# Patient Record
Sex: Male | Born: 1990 | Race: White | Hispanic: No | Marital: Single
Health system: Midwestern US, Community
[De-identification: ages and names within clinical notes are randomized; demographics above are authoritative.]

## PROBLEM LIST (undated history)

## (undated) DIAGNOSIS — M7730 Calcaneal spur, unspecified foot: Secondary | ICD-10-CM

## (undated) HISTORY — PX: TONSILLECTOMY: SUR1361

---

## 2009-04-26 ENCOUNTER — Emergency Department: Payer: Self-pay | Admitting: Emergency Medicine

## 2009-04-27 ENCOUNTER — Emergency Department: Payer: Self-pay | Admitting: Emergency Medicine

## 2015-01-08 ENCOUNTER — Emergency Department (HOSPITAL_COMMUNITY)
Admission: EM | Admit: 2015-01-08 | Discharge: 2015-01-08 | Disposition: A | Discharge status: Home / Self Care | Payer: Self-pay | Attending: Emergency Medicine | Admitting: Emergency Medicine

## 2015-01-08 ENCOUNTER — Encounter (HOSPITAL_COMMUNITY): Payer: Self-pay | Admitting: Emergency Medicine

## 2015-01-08 DIAGNOSIS — E669 Obesity, unspecified: Secondary | ICD-10-CM | POA: Insufficient documentation

## 2015-01-08 DIAGNOSIS — K088 Other specified disorders of teeth and supporting structures: Secondary | ICD-10-CM | POA: Insufficient documentation

## 2015-01-08 DIAGNOSIS — K029 Dental caries, unspecified: Secondary | ICD-10-CM | POA: Insufficient documentation

## 2015-01-08 MED ORDER — HYDROCODONE-ACETAMINOPHEN 5-325 MG PO TABS: 1.0000 | ORAL_TABLET | Freq: Four times a day (QID) | ORAL | Status: DC | PRN

## 2015-01-08 MED ORDER — IBUPROFEN 800 MG PO TABS: 800.0000 mg | ORAL_TABLET | Freq: Three times a day (TID) | ORAL | Status: DC

## 2015-01-08 MED ORDER — PENICILLIN V POTASSIUM 500 MG PO TABS: 500.0000 mg | ORAL_TABLET | Freq: Three times a day (TID) | ORAL | Status: DC

## 2015-01-08 NOTE — ED Notes (Signed)
Piece broke off of uppper back left side tooth couple days ago.

## 2015-01-08 NOTE — Discharge Instructions (Signed)

## 2015-01-08 NOTE — ED Provider Notes (Signed)
CSN: [id number]     Arrival date & time 01/08/15  1120 History   First MD Initiated Contact with Patient 01/08/15 1121     No chief complaint on file.    (Consider location/radiation/quality/duration/timing/severity/associated sxs/prior Treatment) HPI   24 year old male presents for evaluation of dental pain. Patient reports he chipped his left upper tooth possibly 2 days ago while eating. States that he developed sharp shooting pain since the injury and the pain has gotten progressively worse. Pain is worsened with chewing, cold air, and breathing. He has tried over-the-counter ibuprofen with minimal relief. He reports subjective fever. He denies any hearing changes, sore throat, neck pain, chest pain, or rash. He is a smoker and does not have a dentist he is not allergic to any medication.  No past medical history on file. No past surgical history on file. No family history on file. History  Substance Use Topics  . Smoking status: Not on file  . Smokeless tobacco: Not on file  . Alcohol Use: Not on file    Review of Systems  Constitutional: Negative for fever.  HENT: Positive for dental problem. Negative for facial swelling.   Skin: Negative for rash and wound.  Neurological: Negative for numbness and headaches.      Allergies  Review of patient's allergies indicates not on file.  Home Medications   Prior to Admission medications   Not on File   There were no vitals taken for this visit. Physical Exam  Constitutional: He appears well-developed and well-nourished. No distress.  Moderately obese Caucasian male appears to be in no acute distress, talking without difficulty.  HENT:  Head: Atraumatic.  Poor dentition throughout, significant dental decay noted to tooth #15, tender to palpation with mild gingival erythema to the surrounding gumline. No trismus, no obvious abscess amenable for drainage.  Eyes: Conjunctivae are normal.  Neck: Normal range of motion. Neck  supple.  Lymphadenopathy:    He has no cervical adenopathy.  Neurological: He is alert.  Skin: No rash noted.  Psychiatric: He has a normal mood and affect.    ED Course  Procedures (including critical care time)  Patient presents with dental pain, dental referral given along with antibiotic and pain medication.  Labs Review Labs Reviewed - No data to display  Imaging Review No results found.   EKG Interpretation None      MDM   Final diagnoses:  Pain due to dental caries    BP 132/69 mmHg  Pulse 91  Temp(Src) 98.6 F (37 C) (Oral)  Resp 20  SpO2 96%     Fayrene HelperBowie Julious Langlois, PA-C 01/08/15 1131  Gerhard Munchobert Lockwood, MD 01/08/15 1605

## 2015-07-17 ENCOUNTER — Emergency Department (HOSPITAL_COMMUNITY): Payer: Self-pay

## 2015-07-17 ENCOUNTER — Emergency Department (HOSPITAL_COMMUNITY)
Admission: EM | Admit: 2015-07-17 | Discharge: 2015-07-17 | Disposition: A | Discharge status: Home / Self Care | Payer: Self-pay | Attending: Emergency Medicine | Admitting: Emergency Medicine

## 2015-07-17 ENCOUNTER — Encounter (HOSPITAL_COMMUNITY): Payer: Self-pay | Admitting: Emergency Medicine

## 2015-07-17 DIAGNOSIS — Z792 Long term (current) use of antibiotics: Secondary | ICD-10-CM | POA: Insufficient documentation

## 2015-07-17 DIAGNOSIS — Z791 Long term (current) use of non-steroidal anti-inflammatories (NSAID): Secondary | ICD-10-CM | POA: Insufficient documentation

## 2015-07-17 DIAGNOSIS — M722 Plantar fascial fibromatosis: Secondary | ICD-10-CM | POA: Insufficient documentation

## 2015-07-17 DIAGNOSIS — Z720 Tobacco use: Secondary | ICD-10-CM | POA: Insufficient documentation

## 2015-07-17 MED ORDER — NAPROXEN 500 MG PO TABS: 500.0000 mg | ORAL_TABLET | Freq: Two times a day (BID) | ORAL | Status: DC

## 2015-07-17 MED ORDER — TRAMADOL HCL 50 MG PO TABS: 50.0000 mg | ORAL_TABLET | Freq: Four times a day (QID) | ORAL | Status: DC | PRN

## 2015-07-17 NOTE — ED Notes (Signed)
Pt reports 2 month hx of l/foot pain. Pt stated that the foot is cramping with a stabbing pain through his heel.

## 2015-07-17 NOTE — ED Provider Notes (Signed)
CSN: [id number]     Arrival date & time 07/17/15  1631 History  This chart was scribed for non-physician practitioner, Jaynie Crumble, PA-C, working with Mancel Bale, MD, by Budd Palmer ED Scribe. This patient was seen in room WTR5/WTR5 and the patient's care was started at [facility]    Chief Complaint  Patient presents with  . Foot Pain    l/foot pain x 2 months   The history is provided by the patient. No language interpreter was used.   HPI Comments: David Holder is a 24 y.o. male who presents to the Emergency Department complaining of cramping, shooting left heel pain onset 2 months ago. He notes exacerbation with pressure, weight bearing, and walking. He has taken ibuprofen for pain with mild relief. He denies any previous injuries to the foot.   History reviewed. No pertinent past medical history. Past Surgical History  Procedure Laterality Date  . Tonsillectomy     History reviewed. No pertinent family history. History  Substance Use Topics  . Smoking status: Current Some Day Smoker    Types: Cigarettes  . Smokeless tobacco: Not on file  . Alcohol Use: No    Review of Systems  Constitutional: Negative for fever.  Musculoskeletal: Positive for myalgias.  Skin: Negative for wound.    Allergies  Codeine  Home Medications   Prior to Admission medications   Medication Sig Start Date End Date Taking? Authorizing Provider  HYDROcodone-acetaminophen (NORCO/VICODIN) 5-325 MG per tablet Take 1 tablet by mouth every 6 (six) hours as needed for moderate pain or severe pain. 01/08/15   Fayrene Helper, PA-C  ibuprofen (ADVIL,MOTRIN) 800 MG tablet Take 1 tablet (800 mg total) by mouth 3 (three) times daily. 01/08/15   Fayrene Helper, PA-C  penicillin v potassium (VEETID) 500 MG tablet Take 1 tablet (500 mg total) by mouth 3 (three) times daily. 01/08/15   Fayrene Helper, PA-C   BP 127/66 mmHg  Pulse 92  Temp(Src) 97.7 F (36.5 C) (Oral)  Resp 20  SpO2 100% Physical Exam   Constitutional: He is oriented to person, place, and time. He appears well-developed and well-nourished. No distress.  HENT:  Head: Normocephalic and atraumatic.  Mouth/Throat: Oropharynx is clear and moist.  Eyes: Conjunctivae and EOM are normal. Pupils are equal, round, and reactive to light.  Neck: Normal range of motion. Neck supple. No tracheal deviation present.  Cardiovascular: Normal rate.   Pulmonary/Chest: Breath sounds normal. No respiratory distress.  Abdominal: Soft.  Musculoskeletal: Normal range of motion.  Normal-appearing foot and heel. There is no erythema, discoloration, signs of infection. Range of motion of all toes, foot, ankle. Pain with toe extension. Dorsal pedal pulses intact.  Neurological: He is alert and oriented to person, place, and time.  Skin: Skin is warm and dry.  Psychiatric: He has a normal mood and affect. His behavior is normal.  Nursing note and vitals reviewed.   ED Course  Procedures  DIAGNOSTIC STUDIES: Oxygen Saturation is 100% on RA, normal by my interpretation.    COORDINATION OF CARE: 5:32 PM - Discussed possible plantar fasciitis and bone spur on foot XR. Advised to rest, apply ice packs, elevate, and get supportive footwear/orthotics. Advised to take anti-inflammatories for pain. Recommended seeing a podiatrist if the pain does not get better. Pt advised of plan for treatment and pt agrees.  Labs Review Labs Reviewed - No data to display  Imaging Review Dg Foot Complete Left  07/17/2015   CLINICAL DATA:  The left foot pain  on going for 2 months. Possible bone spur. Pain is at the bottom of of OS calcis. Painful to walk. No history of previous injury or surgery.  EXAM: LEFT FOOT - COMPLETE 3+ VIEW  COMPARISON:  None.  FINDINGS: There is no evidence of fracture or dislocation. Soft tissues are unremarkable. Small plantar calcaneal spur is noted.  IMPRESSION: 1.  No evidence for acute  abnormality. 2. Small plantar calcaneal spur.    Electronically Signed   By: Norva Pavlov M.D.   On: 07/17/2015 17:22     EKG Interpretation None      MDM   Final diagnoses:  Plantar fasciitis of left foot    Patient is here with left heel pain. Exam is consistent with plantar fasciitis and heel pain from most likely calcaneal spur as seen on the x-ray. Advised to get a good arch supports. NSAIDs, ice, follow up as needed. Will discharge home with tramadol, naproxen. Follow-up as needed.  Filed Vitals:   07/17/15 1642 07/17/15 1758  BP: 127/66   Pulse: 92 93  Temp: 97.7 F (36.5 C)   TempSrc: Oral   Resp: 20   SpO2: 100% 99%      Jaynie Crumble, PA-C 07/18/15 1647  Mancel Bale, MD 07/18/15 2316

## 2015-07-17 NOTE — Discharge Instructions (Signed)
Naprosyn for pain and inflammation. Tramadol for severe pain. Ice. Elevate. Follow up with primary care doctor or orthopedics specialist. Make sure to get good arch supports. Ice. Follow up with primary care doctor.    Heel Spur A heel spur is a hook of bone that can form on the calcaneus (the heel bone and the largest bone of the foot). Heel spurs are often associated with plantar fasciitis and usually come in people who have had the problem for an extended period of time. The cause of the relationship is unknown. The pain associated with them is thought to be caused by an inflammation (soreness and redness) of the plantar fascia rather than the spur itself. The plantar fascia is a thick fibrous like tissue that runs from the calcaneus (heel bone) to the ball of the foot. This strong, tight tissue helps maintain the arch of your foot. It helps distribute the weight across your foot as you walk or run. Stresses placed on the plantar fascia can be tremendous. When it is inflamed normal activities become painful. Pain is worse in the morning after sleeping. After sleeping the plantar fascia is tight. The first movements stretch the fascia and this causes pain. As the tendon loosens, the pain usually gets better. It often returns with too much standing or walking.  About 70% of patients with plantar fasciitis have a heel spur. About half of people without foot pain also have heel spurs. DIAGNOSIS  The diagnosis of a heel spur is made by X-ray. The X-ray shows a hook of bone protruding from the bottom of the calcaneus at the point where the plantar fascia is attached to the heel bone.  TREATMENT  It is necessary to find out what is causing the stretching of the plantar fascia. If the cause is over-pronation (flat feet), orthotics and proper foot ware may help.  Stretching exercises, losing weight, wearing shoes that have a cushioned heel that absorbs shock, and elevating the heel with the use of a heel  cradle, heel cup, or orthotics may all help. Heel cradles and heel cups provide extra comfort and cushion to the heel, and reduce the amount of shock to the sore area. AVOIDING THE PAIN OF PLANTAR FASCIITIS AND HEEL SPURS  Consult a sports medicine professional before beginning a new exercise program.  Walking programs offer a good workout. There is a lower chance of overuse injuries common to the runners. There is less impact and less jarring of the joints.  Begin all new exercise programs slowly. If problems or pains develop, decrease the amount of time or distance until you are at a comfortable level.  Wear good shoes and replace them regularly.  Stretch your foot and the heel cords at the back of the ankle (Achilles tendons) both before and after exercise.  Run or exercise on even surfaces that are not hard. For example, asphalt is better than pavement.  Do not run barefoot on hard surfaces.  If using a treadmill, vary the incline.  Do not continue to workout if you have foot or joint problems. Seek professional help if they do not improve. HOME CARE INSTRUCTIONS   Avoid activities that cause you pain until you recover.  Use ice or cold packs to the problem or painful areas after working out.  Only take over-the-counter or prescription medicines for pain, discomfort, or fever as directed by your caregiver.  Soft shoe inserts or athletic shoes with air or gel sole cushions may be helpful.  If  problems continue or become more severe, consult a sports medicine caregiver. Cortisone is a potent anti-inflammatory medication that may be injected into the painful area. You can discuss this treatment with your caregiver. MAKE SURE YOU:   Understand these instructions.  Will watch your condition.  Will get help right away if you are not doing well or get worse. Document Released: 01/05/2006 Document Revised: 02/21/2012 Document Reviewed: 01/30/2014 [facility] Patient Information 2015  [place], [place]. This information is not intended to replace advice given to you by your health care provider. Make sure you discuss any questions you have with your health care provider.  Plantar Fasciitis Plantar fasciitis is a common condition that causes foot pain. It is soreness (inflammation) of the band of tough fibrous tissue on the bottom of the foot that runs from the heel bone (calcaneus) to the ball of the foot. The cause of this soreness may be from excessive standing, poor fitting shoes, running on hard surfaces, being overweight, having an abnormal walk, or overuse (this is common in runners) of the painful foot or feet. It is also common in aerobic exercise dancers and ballet dancers. SYMPTOMS  Most people with plantar fasciitis complain of:  Severe pain in the morning on the bottom of their foot especially when taking the first steps out of bed. This pain recedes after a few minutes of walking.  Severe pain is experienced also during walking following a long period of inactivity.  Pain is worse when walking barefoot or up stairs DIAGNOSIS   Your caregiver will diagnose this condition by examining and feeling your foot.  Special tests such as X-rays of your foot, are usually not needed. PREVENTION   Consult a sports medicine professional before beginning a new exercise program.  Walking programs offer a good workout. With walking there is a lower chance of overuse injuries common to runners. There is less impact and less jarring of the joints.  Begin all new exercise programs slowly. If problems or pain develop, decrease the amount of time or distance until you are at a comfortable level.  Wear good shoes and replace them regularly.  Stretch your foot and the heel cords at the back of the ankle (Achilles tendon) both before and after exercise.  Run or exercise on even surfaces that are not hard. For example, asphalt is better than pavement.  Do not run barefoot on hard  surfaces.  If using a treadmill, vary the incline.  Do not continue to workout if you have foot or joint problems. Seek professional help if they do not improve. HOME CARE INSTRUCTIONS   Avoid activities that cause you pain until you recover.  Use ice or cold packs on the problem or painful areas after working out.  Only take over-the-counter or prescription medicines for pain, discomfort, or fever as directed by your caregiver.  Soft shoe inserts or athletic shoes with air or gel sole cushions may be helpful.  If problems continue or become more severe, consult a sports medicine caregiver or your own health care provider. Cortisone is a potent anti-inflammatory medication that may be injected into the painful area. You can discuss this treatment with your caregiver. MAKE SURE YOU:   Understand these instructions.  Will watch your condition.  Will get help right away if you are not doing well or get worse. Document Released: 08/24/2001 Document Revised: 02/21/2012 Document Reviewed: 10/23/2008 [facility] Patient Information 2015 [place], [place]. This information is not intended to replace advice given to you by  your health care provider. Make sure you discuss any questions you have with your health care provider. ° °

## 2015-07-17 NOTE — Progress Notes (Signed)
Orthopedic Tech Progress Note Patient Details:  David Holder September 25, 1991 [id number]  Ortho Devices Type of Ortho Device: Crutches Ortho Device/Splint Interventions: Application   Shawnie Pons 07/17/2015, 6:25 PM

## 2016-04-13 ENCOUNTER — Encounter (HOSPITAL_BASED_OUTPATIENT_CLINIC_OR_DEPARTMENT_OTHER): Payer: Self-pay | Admitting: Emergency Medicine

## 2016-04-13 ENCOUNTER — Emergency Department (HOSPITAL_BASED_OUTPATIENT_CLINIC_OR_DEPARTMENT_OTHER)
Admission: EM | Admit: 2016-04-13 | Discharge: 2016-04-13 | Disposition: A | Discharge status: Home / Self Care | Payer: Self-pay | Attending: Emergency Medicine | Admitting: Emergency Medicine

## 2016-04-13 DIAGNOSIS — K0889 Other specified disorders of teeth and supporting structures: Secondary | ICD-10-CM | POA: Insufficient documentation

## 2016-04-13 DIAGNOSIS — F17210 Nicotine dependence, cigarettes, uncomplicated: Secondary | ICD-10-CM | POA: Insufficient documentation

## 2016-04-13 MED ORDER — PENICILLIN V POTASSIUM 500 MG PO TABS: 500.0000 mg | ORAL_TABLET | Freq: Four times a day (QID) | ORAL | Status: DC

## 2016-04-13 MED ORDER — NAPROXEN 500 MG PO TABS: 500.0000 mg | ORAL_TABLET | Freq: Two times a day (BID) | ORAL | Status: DC

## 2016-04-13 NOTE — Discharge Instructions (Signed)

## 2016-04-13 NOTE — ED Notes (Signed)
Went to room to discharge patient.  Pt had left prior to receiving paperwork.   Unable to locate patient in lobby or near pharmacy.

## 2016-04-13 NOTE — ED Provider Notes (Signed)
CSN: [id number]     Arrival date & time 04/13/16  7829 History   First MD Initiated Contact with Patient 04/13/16 [phone]     Chief Complaint  Patient presents with  . Dental Pain    David Holder is a 25 y.o. male who presents to the ED Complaining of one month of right upper dental pain that has worsened over the past several weeks. He complains of 8 out of 10 right upper dental pain currently. He reports several months ago he broke his right upper molar and since has had worsening pain. He reports seeing some ibuprofen with little relief today. He reports his pain is worse with chewing and eating. He has not been able to follow-up with her dental provider. He denies sore throat, fevers, trouble swallowing, discharge from his mouth, facial swelling, ear pain, ear discharge, neck pain.   Patient is a 25 y.o. male presenting with tooth pain. The history is provided by the patient. No language interpreter was used.  Dental Pain Associated symptoms: no drooling, no facial swelling, no fever, no headaches and no neck pain     History reviewed. No pertinent past medical history. Past Surgical History  Procedure Laterality Date  . Tonsillectomy     No family history on file. Social History  Substance Use Topics  . Smoking status: Current Some Day Smoker    Types: Cigarettes  . Smokeless tobacco: None  . Alcohol Use: No    Review of Systems  Constitutional: Negative for fever and chills.  HENT: Positive for dental problem. Negative for drooling, ear pain, facial swelling, sore throat and trouble swallowing.   Eyes: Negative for pain and visual disturbance.  Gastrointestinal: Negative for nausea and vomiting.  Musculoskeletal: Negative for neck pain.  Skin: Negative for rash.  Neurological: Negative for headaches.      Allergies  Codeine  Home Medications   Prior to Admission medications   Medication Sig Start Date End Date Taking? Authorizing Provider  naproxen (NAPROSYN) 500  MG tablet Take 1 tablet (500 mg total) by mouth 2 (two) times daily with a meal. 04/13/16   Everlene Farrier, PA-C  penicillin v potassium (VEETID) 500 MG tablet Take 1 tablet (500 mg total) by mouth 4 (four) times daily. 04/13/16   Everlene Farrier, PA-C  traMADol (ULTRAM) 50 MG tablet Take 1 tablet (50 mg total) by mouth every 6 (six) hours as needed. 07/17/15   Tatyana Kirichenko, PA-C   BP 149/90 mmHg  Pulse 114  Temp(Src) 98.7 F (37.1 C) (Oral)  Resp 20  Ht  (1.753 m)  Wt 136.079 kg  BMI 44.28 kg/m2  SpO2 99% Physical Exam  Constitutional: He is oriented to person, place, and time. He appears well-developed and well-nourished. No distress.  Non-toxic appearing.   HENT:  Head: Normocephalic and atraumatic.  Right Ear: External ear normal.  Left Ear: External ear normal.  Mouth/Throat: Oropharynx is clear and moist. No oropharyngeal exudate.  Tenderness to right upper molar which is cracked. Multiple dental caries and poor dentition.  No discharge from the mouth. No facial swelling.  Uvula is midline without edema. Soft palate rises symmetrically. No tonsillar hypertrophy or exudates. Tongue protrusion is normal.  No trismus.   Eyes: Conjunctivae and EOM are normal. Pupils are equal, round, and reactive to light. Right eye exhibits no discharge. Left eye exhibits no discharge.  Neck: Normal range of motion. Neck supple. No JVD present. No tracheal deviation present.  Cardiovascular: Normal rate, regular rhythm,  normal heart sounds and intact distal pulses.   HR is 88  Pulmonary/Chest: Effort normal and breath sounds normal. No respiratory distress.  Lymphadenopathy:    He has no cervical adenopathy.  Neurological: He is alert and oriented to person, place, and time. Coordination normal.  Skin: Skin is warm and dry. No rash noted. He is not diaphoretic. No erythema. No pallor.  Psychiatric: He has a normal mood and affect. His behavior is normal.  Nursing note and vitals  reviewed.   ED Course  Procedures (including critical care time) Labs Review Labs Reviewed - No data to display  Imaging Review No results found.    EKG Interpretation None      Filed Vitals:   04/13/16 0908  BP: 149/90  Pulse: 114  Temp: 98.7 F (37.1 C)  TempSrc: Oral  Resp: 20  Height: 5\' 9"  (1.753 m)  Weight: 136.079 kg  SpO2: 99%     MDM   Meds given in ED:  Medications - No data to display  New Prescriptions   NAPROXEN (NAPROSYN) 500 MG TABLET    Take 1 tablet (500 mg total) by mouth 2 (two) times daily with a meal.   PENICILLIN V POTASSIUM (VEETID) 500 MG TABLET    Take 1 tablet (500 mg total) by mouth 4 (four) times daily.    Final diagnoses:  Pain, dental   This is a 25 y.o. male who presents to the ED Complaining of one month of right upper dental pain that has worsened over the past several weeks. He complains of 8 out of 10 right upper dental pain currently. He reports several months ago he broke his right upper molar and since has had worsening pain. Patient with toothache.  No gross abscess.  Exam unconcerning for Ludwig's angina or spread of infection.  Will treat with penicillin and pain medicine.  Urged patient to follow-up with dentist Dr. Russella DarBenitez and take his discharge instructions to his appointment. I advised the patient to follow-up with their primary care provider this week. I advised the patient to return to the emergency department with new or worsening symptoms or new concerns. The patient verbalized understanding and agreement with plan.     Everlene FarrierWilliam Ivannia Willhelm, PA-C 04/13/16 1021  Jerelyn ScottMartha Linker, MD 04/13/16 1030

## 2016-04-13 NOTE — ED Notes (Addendum)
R upper tooth pain x several weeks. Pt states he has a broken tooth and cannot afford a dentist.

## 2016-05-21 ENCOUNTER — Encounter (HOSPITAL_COMMUNITY): Payer: Self-pay | Admitting: Emergency Medicine

## 2016-05-21 ENCOUNTER — Emergency Department (HOSPITAL_COMMUNITY)
Admission: EM | Admit: 2016-05-21 | Discharge: 2016-05-22 | Disposition: A | Discharge status: Home / Self Care | Payer: Self-pay | Attending: Emergency Medicine | Admitting: Emergency Medicine

## 2016-05-21 DIAGNOSIS — F17210 Nicotine dependence, cigarettes, uncomplicated: Secondary | ICD-10-CM | POA: Insufficient documentation

## 2016-05-21 DIAGNOSIS — M79672 Pain in left foot: Secondary | ICD-10-CM | POA: Insufficient documentation

## 2016-05-21 NOTE — ED Notes (Signed)
Pt from home with complaints of pain in his left foot related to a bone spur. Pt states that he has flare ups of this pain every few months and they usually give him prednisone and something for pain. Pt states the pain began a few days ago, but he normally has a flare up every few months

## 2016-05-22 MED ORDER — TRAMADOL HCL 50 MG PO TABS
50.0000 mg | ORAL_TABLET | Freq: Four times a day (QID) | ORAL | Status: DC | PRN
Start: 2016-05-22 — End: 2016-07-01

## 2016-05-22 MED ORDER — NAPROXEN 500 MG PO TABS: 500.0000 mg | ORAL_TABLET | Freq: Two times a day (BID) | ORAL | Status: DC

## 2016-05-22 NOTE — Discharge Instructions (Signed)
Take your medications as prescribed as needed for pain relief. I recommend resting, elevating and applying ice to her left foot for 15-20 minutes 3-4 times daily. I also recommend wearing heel inserts in your shoes and doing stretching exercises to stretch your calf. Please follow up with a primary care provider from the Resource Guide provided below in 1 week as needed if your symptoms have not improved. Please return to the Emergency Department if symptoms worsen or new onset of fever, redness, swelling, numbness, tingling, weakness.

## 2016-05-22 NOTE — ED Provider Notes (Signed)
CSN: [id number]     Arrival date & time 05/21/16  2234 History   First MD Initiated Contact with Patient 05/21/16 2326     Chief Complaint  Patient presents with  . Foot Pain    left      (Consider location/radiation/quality/duration/timing/severity/associated sxs/prior Treatment) HPI   Pt is a 25 yo male who presents to the ED with complaint of left foot pain, onset 2 days. Pt reports having worsening sharp shooting pain in his left heel that radiates up the sole of his foot. He notes pain is worse with bearing weight or ambulating. Endorses associated swelling. Denies any recent fall, trauma or injury. He notes his pain is consistent with pain he has had in the past related to his heel spur. He notes he has been taking ibuprofen at home with mild relief. Denies fever, chills, redness, warmth, numbness, tingling, weakness.  No past medical history on file. Past Surgical History  Procedure Laterality Date  . Tonsillectomy     No family history on file. Social History  Substance Use Topics  . Smoking status: Current Some Day Smoker    Types: Cigarettes  . Smokeless tobacco: None  . Alcohol Use: No    Review of Systems  Constitutional: Negative for fever.  Musculoskeletal: Positive for joint swelling and arthralgias (left foot).  Skin: Negative for wound.  Neurological: Negative for weakness and numbness.      Allergies  Haloperidol and Codeine  Home Medications   Prior to Admission medications   Medication Sig Start Date End Date Taking? Authorizing Provider  ibuprofen (ADVIL,MOTRIN) 200 MG tablet Take 800 mg by mouth every 8 (eight) hours as needed (for pain.).   Yes Historical Provider, MD  naproxen (NAPROSYN) 500 MG tablet Take 1 tablet (500 mg total) by mouth 2 (two) times daily. 05/22/16   Barrett HenleNicole Elizabeth Nadeau, PA-C  penicillin v potassium (VEETID) 500 MG tablet Take 1 tablet (500 mg total) by mouth 4 (four) times daily. Patient not taking: Reported on 05/21/2016  04/13/16   Everlene FarrierWilliam Dansie, PA-C  traMADol (ULTRAM) 50 MG tablet Take 1 tablet (50 mg total) by mouth every 6 (six) hours as needed. 05/22/16   Satira SarkNicole Elizabeth Nadeau, PA-C   BP 142/91 mmHg  Pulse 95  Temp(Src) 98.9 F (37.2 C) (Oral)  Resp 16  Ht 5\' 9"  (1.753 m)  Wt 136.079 kg  BMI 44.28 kg/m2  SpO2 99% Physical Exam  Constitutional: He is oriented to person, place, and time. He appears well-developed and well-nourished.  HENT:  Head: Normocephalic and atraumatic.  Eyes: Conjunctivae and EOM are normal. Right eye exhibits no discharge. Left eye exhibits no discharge. No scleral icterus.  Neck: Normal range of motion. Neck supple.  Cardiovascular: Normal rate.   Pulmonary/Chest: Effort normal. No respiratory distress.  Musculoskeletal: Normal range of motion. He exhibits tenderness. He exhibits no edema.       Left foot: There is tenderness. There is normal range of motion, no swelling, normal capillary refill, no crepitus, no deformity and no laceration.       Feet:  FROM of left knee, ankle and foot with 5/5 strength. 2+ DP pulse. Cap refill <2. Sensation grossly intact. TTP over left inferior calcaneous. No swelling, redness, warmth, abrasion or laceration noted.  Neurological: He is alert and oriented to person, place, and time.  Nursing note and vitals reviewed.   ED Course  Procedures (including critical care time) Labs Review Labs Reviewed - No data to display  Imaging  Review No results found. I have personally reviewed and evaluated these images and lab results as part of my medical decision-making.   EKG Interpretation None      MDM   Final diagnoses:  Left foot pain    Pt presents with left heel pain which is consistent with flares of plantar fascitis he has had in the past. Denies any recent fall, injury or trauma. VSS. Exam revealed TTP over left calcaneous, left lower extremity neurovascularly intact. No evidence of injury, infection or septic joint. Exam  appears consistent with plantar fascitis. Prior ED visit revealed pt to have heel spur. Due to pt denying any recent injury and exam consistent with pt's hx of plantar fascitis, I do not feel that any imaging is warranted at this time. Plan to d/c pt home with NSADIs, ice and recommend using heel inserts. Pt given info to follow up with PCP regarding further management. Discussed return precautions with pt.     Satira Sark [place], [place] 05/22/16 0025  Jacalyn Lefevre, MD 05/22/16 [phone]

## 2016-07-01 ENCOUNTER — Encounter (HOSPITAL_COMMUNITY): Payer: Self-pay | Admitting: *Deleted

## 2016-07-01 ENCOUNTER — Emergency Department (HOSPITAL_COMMUNITY)
Admission: EM | Admit: 2016-07-01 | Discharge: 2016-07-01 | Disposition: A | Discharge status: Home / Self Care | Payer: Self-pay | Attending: Emergency Medicine | Admitting: Emergency Medicine

## 2016-07-01 DIAGNOSIS — M79672 Pain in left foot: Secondary | ICD-10-CM | POA: Insufficient documentation

## 2016-07-01 DIAGNOSIS — Z791 Long term (current) use of non-steroidal anti-inflammatories (NSAID): Secondary | ICD-10-CM | POA: Insufficient documentation

## 2016-07-01 DIAGNOSIS — F17210 Nicotine dependence, cigarettes, uncomplicated: Secondary | ICD-10-CM | POA: Insufficient documentation

## 2016-07-01 HISTORY — DX: Calcaneal spur, unspecified foot: M77.30

## 2016-07-01 MED ORDER — TRAMADOL HCL 50 MG PO TABS
50.0000 mg | ORAL_TABLET | Freq: Four times a day (QID) | ORAL | Status: DC | PRN
Start: 2016-07-01 — End: 2017-01-15

## 2016-07-01 MED ORDER — PREDNISONE 10 MG (21) PO TBPK: 10.0000 mg | ORAL_TABLET | Freq: Every day | ORAL | Status: DC

## 2016-07-01 NOTE — ED Notes (Signed)
Pt states that he has a known heel spur to his left foot; pt states that he began a job 2 weeks ago that requires him to stand; pt states that the standing has flared up his pain; pt c/o increased pain to left foot and states that it hurts to stand on foot; pt states that he has been taking ibuprofen without relief

## 2016-07-01 NOTE — ED Notes (Signed)
PT DISCHARGED. INSTRUCTIONS AND PRESCRIPTIONS GIVEN. AAOX4. PT IN NO APPARENT DISTRESS. THE OPPORTUNITY TO ASK QUESTIONS WAS PROVIDED. 

## 2016-07-01 NOTE — ED Provider Notes (Signed)
CSN: [id number]     Arrival date & time 07/01/16  2117 History   First MD Initiated Contact with Patient 07/01/16 2201     Chief Complaint  Patient presents with  . Foot Pain     (Consider location/radiation/quality/duration/timing/severity/associated sxs/prior Treatment) HPI Comments: 25 year old male complains of constant sharp left heel pain 2 weeks that began worse after he start a new job which requires him to stand for prolonged periods of time. Denies any fever or chills. Has had this before in the past which is minor well to all trend as well as corticosteroids. Denies any trauma. Pain better with rest.  Patient is a 25 y.o. male presenting with lower extremity pain. The history is provided by the patient.  Foot Pain    Past Medical History  Diagnosis Date  . Heel spur    Past Surgical History  Procedure Laterality Date  . Tonsillectomy     No family history on file. Social History  Substance Use Topics  . Smoking status: Current Some Day Smoker -- 0.50 packs/day    Types: Cigarettes  . Smokeless tobacco: None  . Alcohol Use: No    Review of Systems  All other systems reviewed and are negative.     Allergies  Haloperidol and Codeine  Home Medications   Prior to Admission medications   Medication Sig Start Date End Date Taking? Authorizing Provider  ibuprofen (ADVIL,MOTRIN) 200 MG tablet Take 800 mg by mouth every 8 (eight) hours as needed (for pain.).   Yes Historical Provider, MD  naproxen (NAPROSYN) 500 MG tablet Take 1 tablet (500 mg total) by mouth 2 (two) times daily. Patient not taking: Reported on 07/01/2016 05/22/16   Barrett Henle, PA-C  penicillin v potassium (VEETID) 500 MG tablet Take 1 tablet (500 mg total) by mouth 4 (four) times daily. Patient not taking: Reported on 05/21/2016 04/13/16   Everlene Farrier, PA-C  predniSONE (STERAPRED UNI-PAK 21 TAB) 10 MG (21) TBPK tablet Take 1 tablet (10 mg total) by mouth daily. Take 6 tabs by mouth  daily  for 2 days, then 5 tabs for 2 days, then 4 tabs for 2 days, then 3 tabs for 2 days, 2 tabs for 2 days, then 1 tab by mouth daily for 2 days 07/01/16   Lorre Nick, MD  traMADol (ULTRAM) 50 MG tablet Take 1 tablet (50 mg total) by mouth every 6 (six) hours as needed. 07/01/16   Lorre Nick, MD   BP 140/82 mmHg  Pulse 94  Temp(Src) 98.1 F (36.7 C) (Oral)  Resp 18  SpO2 100% Physical Exam  Constitutional: He is oriented to person, place, and time. He appears well-developed and well-nourished.  Non-toxic appearance.  HENT:  Head: Normocephalic and atraumatic.  Eyes: Conjunctivae are normal. Pupils are equal, round, and reactive to light.  Neck: Normal range of motion.  Cardiovascular: Normal rate.   Pulmonary/Chest: Effort normal.  Musculoskeletal:       Feet:  Neurological: He is alert and oriented to person, place, and time.  Skin: Skin is warm and dry.  Psychiatric: He has a normal mood and affect.  Nursing note and vitals reviewed.   ED Course  Procedures (including critical care time) Labs Review Labs Reviewed - No data to display  Imaging Review No results found. I have personally reviewed and evaluated these images and lab results as part of my medical decision-making.   EKG Interpretation None      MDM   Final diagnoses:  Left foot pain    Patient likely exacerbation of his chronic left heel pain. Placed on prednisone as well as Ultram    Lorre NickAnthony Jamari Moten, MD 07/01/16 2211

## 2016-07-01 NOTE — Discharge Instructions (Signed)
Take the medications as directed

## 2016-07-15 ENCOUNTER — Encounter (HOSPITAL_COMMUNITY): Payer: Self-pay

## 2016-07-15 ENCOUNTER — Emergency Department (HOSPITAL_COMMUNITY)
Admission: EM | Admit: 2016-07-15 | Discharge: 2016-07-15 | Disposition: A | Discharge status: Home / Self Care | Payer: Self-pay | Attending: Emergency Medicine | Admitting: Emergency Medicine

## 2016-07-15 DIAGNOSIS — M7752 Other enthesopathy of left foot: Secondary | ICD-10-CM | POA: Insufficient documentation

## 2016-07-15 DIAGNOSIS — M7732 Calcaneal spur, left foot: Secondary | ICD-10-CM

## 2016-07-15 DIAGNOSIS — F17210 Nicotine dependence, cigarettes, uncomplicated: Secondary | ICD-10-CM | POA: Insufficient documentation

## 2016-07-15 MED ORDER — TRAMADOL HCL 50 MG PO TABS: 50.0000 mg | ORAL_TABLET | Freq: Four times a day (QID) | ORAL | 0 refills | Status: DC | PRN

## 2016-07-15 NOTE — ED Provider Notes (Signed)
[facility]-EMERGENCY DEPT Provider Note   CSN: [id number] Arrival date & time: 07/15/16  6387  First Provider Contact:  None       History   Chief Complaint Chief Complaint  Patient presents with  . Foot Pain    HPI David Holder is a 25 y.o. male.  Patient with  known L heel spur presents with worsening pain   States he must be on his feet for 7 hours a day at work and may have to quit his job due to the pain, having no insurance and few resources      Past Medical History:  Diagnosis Date  . Heel spur     There are no active problems to display for this patient.   Past Surgical History:  Procedure Laterality Date  . TONSILLECTOMY         Home Medications    Prior to Admission medications   Medication Sig Start Date End Date Taking? Authorizing Provider  ibuprofen (ADVIL,MOTRIN) 200 MG tablet Take 800 mg by mouth every 8 (eight) hours as needed (for pain.).    Historical Provider, MD  naproxen (NAPROSYN) 500 MG tablet Take 1 tablet (500 mg total) by mouth 2 (two) times daily. Patient not taking: Reported on 07/01/2016 05/22/16   Barrett Henle, PA-C  penicillin v potassium (VEETID) 500 MG tablet Take 1 tablet (500 mg total) by mouth 4 (four) times daily. Patient not taking: Reported on 05/21/2016 04/13/16   Everlene Farrier, PA-C  predniSONE (STERAPRED UNI-PAK 21 TAB) 10 MG (21) TBPK tablet Take 1 tablet (10 mg total) by mouth daily. Take 6 tabs by mouth daily  for 2 days, then 5 tabs for 2 days, then 4 tabs for 2 days, then 3 tabs for 2 days, 2 tabs for 2 days, then 1 tab by mouth daily for 2 days 07/01/16   Lorre Nick, MD  traMADol (ULTRAM) 50 MG tablet Take 1 tablet (50 mg total) by mouth every 6 (six) hours as needed. 07/01/16   Lorre Nick, MD  traMADol (ULTRAM) 50 MG tablet Take 1 tablet (50 mg total) by mouth every 6 (six) hours as needed. 07/15/16   Earley Favor, NP    Family History History reviewed. No pertinent family history.  Social  History Social History  Substance Use Topics  . Smoking status: Current Some Day Smoker    Packs/day: 0.50    Types: Cigarettes  . Smokeless tobacco: Current User  . Alcohol use No     Allergies   Haloperidol and Codeine   Review of Systems Review of Systems  Musculoskeletal: Positive for myalgias. Negative for joint swelling.     Physical Exam Updated Vital Signs BP (!) 151/106 (BP Location: Right Arm)   Pulse 116   Temp 98.5 F (36.9 C) (Oral)   Resp 20   Ht  (1.753 m)   Wt 136.1 kg   SpO2 100%   BMI 44.30 kg/m   Physical Exam  Constitutional: He appears well-developed and well-nourished.  HENT:  Head: Normocephalic.  Eyes: Pupils are equal, round, and reactive to light.  Neck: Normal range of motion.  Cardiovascular: Normal rate.   Pulmonary/Chest: Effort normal.  Musculoskeletal: He exhibits tenderness. He exhibits no edema or deformity.  Neurological: He is alert.  Skin: Skin is warm. No erythema.  Nursing note and vitals reviewed.    ED Treatments / Results  Labs (all labs ordered are listed, but only abnormal results are displayed) Labs Reviewed - No data  to display  EKG  EKG Interpretation None       Radiology No results found.  Procedures Procedures (including critical care time)  Medications Ordered in ED Medications - No data to display   Initial Impression / Assessment and Plan / ED Course  I have reviewed the triage vital signs and the nursing notes.  Pertinent labs & imaging results that were available during my care of the patient were reviewed by me and considered in my medical decision making (see chart for details).  Clinical Course     No outward indication of any abnormality  Final Clinical Impressions(s) / ED Diagnoses   Final diagnoses:  Heel spur, left    New Prescriptions New Prescriptions   TRAMADOL (ULTRAM) 50 MG TABLET    Take 1 tablet (50 mg total) by mouth every 6 (six) hours as needed.      Earley Favor, NP 07/15/16 0500    Shon Baton, MD 07/15/16 [phone]

## 2016-07-15 NOTE — Discharge Instructions (Signed)
Look into a better Heel Cup for your shoe  try a medical supply store  Make an appointment with [facility] to establish regular medical care who than can refer you to a podiatrist

## 2016-07-15 NOTE — ED Triage Notes (Signed)
Pt complains of a bone spur in his left heel for several weeks

## 2016-10-28 ENCOUNTER — Emergency Department (HOSPITAL_COMMUNITY)
Admission: EM | Admit: 2016-10-28 | Discharge: 2016-10-28 | Disposition: A | Discharge status: Home / Self Care | Payer: Self-pay | Attending: Emergency Medicine | Admitting: Emergency Medicine

## 2016-10-28 ENCOUNTER — Encounter (HOSPITAL_COMMUNITY): Payer: Self-pay | Admitting: Emergency Medicine

## 2016-10-28 DIAGNOSIS — F17210 Nicotine dependence, cigarettes, uncomplicated: Secondary | ICD-10-CM | POA: Insufficient documentation

## 2016-10-28 DIAGNOSIS — M722 Plantar fascial fibromatosis: Secondary | ICD-10-CM | POA: Insufficient documentation

## 2016-10-28 DIAGNOSIS — Z79899 Other long term (current) drug therapy: Secondary | ICD-10-CM | POA: Insufficient documentation

## 2016-10-28 DIAGNOSIS — M79672 Pain in left foot: Secondary | ICD-10-CM

## 2016-10-28 MED ORDER — PREDNISONE 20 MG PO TABS
60.0000 mg | ORAL_TABLET | Freq: Once | ORAL | Status: AC
  Administered 2016-10-28: 60 mg via ORAL
  Filled 2016-10-28: qty 3

## 2016-10-28 MED ORDER — PREDNISONE 20 MG PO TABS: 40.0000 mg | ORAL_TABLET | Freq: Every day | ORAL | 0 refills | Status: DC

## 2016-10-28 MED ORDER — IBUPROFEN 800 MG PO TABS: 800.0000 mg | ORAL_TABLET | Freq: Three times a day (TID) | ORAL | 0 refills | Status: DC

## 2016-10-28 MED ORDER — TRAMADOL HCL 50 MG PO TABS
50.0000 mg | ORAL_TABLET | Freq: Once | ORAL | Status: AC
  Administered 2016-10-28: 50 mg via ORAL
  Filled 2016-10-28: qty 1

## 2016-10-28 MED ORDER — TRAMADOL HCL 50 MG PO TABS: 50.0000 mg | ORAL_TABLET | Freq: Four times a day (QID) | ORAL | 0 refills | Status: DC | PRN

## 2016-10-28 NOTE — ED Notes (Signed)
ED Provider at bedside. 

## 2016-10-28 NOTE — ED Triage Notes (Signed)
Patient reports pain/swelling to left foot starting about 1 week ago. Patient has a hx of a bone spur and occasionally has flair ups. Patient states prednisone and tramadol usually helps with his pain.

## 2016-10-28 NOTE — ED Notes (Signed)
Discharge instructions, follow up care, and rx x3 reviewed with patient. Patient verbalized understanding. 

## 2016-10-28 NOTE — ED Provider Notes (Signed)
[facility]-EMERGENCY DEPT Provider Note   CSN: [id number] Arrival date & time: 10/28/16  1348  By signing my name below, I, Teofilo Pod, attest that this documentation has been prepared under the direction and in the presence of Danelle Berry, PA-C. Electronically Signed: Teofilo Pod, ED Scribe. 10/28/2016. 2:20 PM.    History   Chief Complaint Chief Complaint  Patient presents with  . Foot Pain    left    The history is provided by the patient. No language interpreter was used.   HPI Comments:  David Holder is a 25 y.o. male who presents to the Emergency Department complaining of constant pain to his left foot x 1 week. Pt reports associated swelling to the area. Pt describes the pain as "stabbing" when he is standing, and rates the pain at 9/10. Pt reports a hx of a bone spur in his left heel that occasionally has flare ups. Pt states that prednisone and tramadol typically help with pain associated with his bone spur flare ups. Pt works in [employer] and is on his feet for many hours a day. Pt takes ibuprofen, but it has stopped providing relief for the past week. Pt denies fever and redness to the area.  He saw ortho nearly a decade ago for it and was told he would need surgery, no ortho visits or management since that time.  Past Medical History:  Diagnosis Date  . Heel spur     There are no active problems to display for this patient.   Past Surgical History:  Procedure Laterality Date  . TONSILLECTOMY         Home Medications    Prior to Admission medications   Medication Sig Start Date End Date Taking? Authorizing Provider  ibuprofen (ADVIL,MOTRIN) 800 MG tablet Take 1 tablet (800 mg total) by mouth 3 (three) times daily. 10/28/16   Danelle Berry, PA-C  naproxen (NAPROSYN) 500 MG tablet Take 1 tablet (500 mg total) by mouth 2 (two) times daily. Patient not taking: Reported on 07/01/2016 05/22/16   Barrett Henle, PA-C  penicillin v potassium  (VEETID) 500 MG tablet Take 1 tablet (500 mg total) by mouth 4 (four) times daily. Patient not taking: Reported on 05/21/2016 04/13/16   Everlene Farrier, PA-C  predniSONE (DELTASONE) 20 MG tablet Take 2 tablets (40 mg total) by mouth daily. 10/28/16   Danelle Berry, PA-C  predniSONE (STERAPRED UNI-PAK 21 TAB) 10 MG (21) TBPK tablet Take 1 tablet (10 mg total) by mouth daily. Take 6 tabs by mouth daily  for 2 days, then 5 tabs for 2 days, then 4 tabs for 2 days, then 3 tabs for 2 days, 2 tabs for 2 days, then 1 tab by mouth daily for 2 days 07/01/16   Lorre Nick, MD  traMADol (ULTRAM) 50 MG tablet Take 1 tablet (50 mg total) by mouth every 6 (six) hours as needed. 07/01/16   Lorre Nick, MD  traMADol (ULTRAM) 50 MG tablet Take 1 tablet (50 mg total) by mouth every 6 (six) hours as needed. 07/15/16   Earley Favor, NP  traMADol (ULTRAM) 50 MG tablet Take 1 tablet (50 mg total) by mouth every 6 (six) hours as needed. 10/28/16   Danelle Berry, PA-C    Family History No family history on file.  Social History Social History  Substance Use Topics  . Smoking status: Current Some Day Smoker    Packs/day: 0.50    Types: Cigarettes  . Smokeless tobacco: Current User  .  Alcohol use No     Allergies   Haloperidol and Codeine   Review of Systems Review of Systems 10 Systems reviewed and are negative for acute change except as noted in the HPI.   Physical Exam Updated Vital Signs BP 125/81 (BP Location: Right Arm)   Pulse 87   Temp 98.4 F (36.9 C) (Oral)   Resp 18   Ht 5\' 9"  (1.753 m)   Wt 136.1 kg   SpO2 95%   BMI 44.30 kg/m   Physical Exam  Constitutional: He is oriented to person, place, and time. He appears well-developed and well-nourished. No distress.  HENT:  Head: Normocephalic and atraumatic.  Right Ear: External ear normal.  Left Ear: External ear normal.  Nose: Nose normal.  Mouth/Throat: Oropharynx is clear and moist. No oropharyngeal exudate.  Eyes: Conjunctivae and EOM are  normal. Pupils are equal, round, and reactive to light. Right eye exhibits no discharge. Left eye exhibits no discharge. No scleral icterus.  Neck: Normal range of motion. Neck supple. No JVD present. No tracheal deviation present.  Cardiovascular: Normal rate and regular rhythm.   Pulmonary/Chest: Effort normal and breath sounds normal. No stridor. No respiratory distress.  Musculoskeletal: Normal range of motion. He exhibits tenderness. He exhibits no edema.       Left ankle: He exhibits normal range of motion, no swelling, no ecchymosis, no deformity, no laceration and normal pulse. Achilles tendon exhibits pain. Achilles tendon exhibits no defect and normal Thompson's test results.       Feet:  Left ankle ttp to achilles, calcaneus and to proximal plantar fascia. Normal ROM of ankle and foot with plantar and dorsiflexion, inversion and eversion, no discernable edema when compared to right ankle, no erythema Bilateral DP and PT pulses 2+, normal sensation, normal strength  Lymphadenopathy:    He has no cervical adenopathy.  Neurological: He is alert and oriented to person, place, and time. He exhibits normal muscle tone. Coordination normal.  Skin: Skin is warm and dry. No rash noted. He is not diaphoretic. No erythema. No pallor.  Psychiatric: He has a normal mood and affect. His behavior is normal. Judgment and thought content normal.  Nursing note and vitals reviewed.    ED Treatments / Results  DIAGNOSTIC STUDIES:  Oxygen Saturation is 95% on RA, normal by my interpretation.    COORDINATION OF CARE:  2:13 PM Discussed treatment plan with pt at bedside and pt agreed to plan.   Labs (all labs ordered are listed, but only abnormal results are displayed) Labs Reviewed - No data to display  EKG  EKG Interpretation None       Radiology No results found.  Procedures Procedures (including critical care time)  Medications Ordered in ED Medications  traMADol (ULTRAM)  tablet 50 mg (50 mg Oral Given 10/28/16 1453)  predniSONE (DELTASONE) tablet 60 mg (60 mg Oral Given 10/28/16 1453)     Initial Impression / Assessment and Plan / ED Course  I have reviewed the triage vital signs and the nursing notes.  Pertinent labs & imaging results that were available during my care of the patient were reviewed by me and considered in my medical decision making (see chart for details).  Clinical Course    Pt with left heel pain, is recurrent for over a decade, has hx of left heel spur. No concerning finding on exam, he has ttp to achilles, calcaneua and to prox plantar fascia, suspect plantar fasciitis and Achilles tendinitis or bursitis.  He has no Achilles defect and he has a negative Thompson test, no history concerning for an acute rupture or Achilles tear.  He reports effective treatment in the past with anti-inflammatories, short steroid burst and NSAIDs.  We'll place him in a cam walker and initiate treatment with steroid burst, NSAIDs and tramadol for pain. He is encouraged to follow-up with orthopedic referral.  Return precautions reviewed. Patient verbalized understanding, was discharged from the ER in good condition with stable vital signs  Final Clinical Impressions(s) / ED Diagnoses   Final diagnoses:  Pain of left heel  Plantar fasciitis of left foot    New Prescriptions New Prescriptions   IBUPROFEN (ADVIL,MOTRIN) 800 MG TABLET    Take 1 tablet (800 mg total) by mouth 3 (three) times daily.   PREDNISONE (DELTASONE) 20 MG TABLET    Take 2 tablets (40 mg total) by mouth daily.   TRAMADOL (ULTRAM) 50 MG TABLET    Take 1 tablet (50 mg total) by mouth every 6 (six) hours as needed.  I personally performed the services described in this documentation, which was scribed in my presence. The recorded information has been reviewed and is accurate.      Danelle BerryLeisa Ramon Zanders, PA-C 10/28/16 1453    Pricilla LovelessScott Goldston, MD 11/01/16 [phone]

## 2017-01-15 ENCOUNTER — Encounter (HOSPITAL_COMMUNITY): Payer: Self-pay | Admitting: Emergency Medicine

## 2017-01-15 ENCOUNTER — Emergency Department (HOSPITAL_COMMUNITY)
Admission: EM | Admit: 2017-01-15 | Discharge: 2017-01-15 | Disposition: A | Discharge status: Home / Self Care | Payer: Self-pay | Attending: Emergency Medicine | Admitting: Emergency Medicine

## 2017-01-15 ENCOUNTER — Emergency Department (HOSPITAL_COMMUNITY): Payer: Self-pay

## 2017-01-15 DIAGNOSIS — S39012A Strain of muscle, fascia and tendon of lower back, initial encounter: Secondary | ICD-10-CM | POA: Insufficient documentation

## 2017-01-15 DIAGNOSIS — S93502A Unspecified sprain of left great toe, initial encounter: Secondary | ICD-10-CM | POA: Insufficient documentation

## 2017-01-15 DIAGNOSIS — Y999 Unspecified external cause status: Secondary | ICD-10-CM | POA: Insufficient documentation

## 2017-01-15 DIAGNOSIS — F17210 Nicotine dependence, cigarettes, uncomplicated: Secondary | ICD-10-CM | POA: Insufficient documentation

## 2017-01-15 DIAGNOSIS — X58XXXA Exposure to other specified factors, initial encounter: Secondary | ICD-10-CM | POA: Insufficient documentation

## 2017-01-15 DIAGNOSIS — Y9289 Other specified places as the place of occurrence of the external cause: Secondary | ICD-10-CM | POA: Insufficient documentation

## 2017-01-15 DIAGNOSIS — Y9301 Activity, walking, marching and hiking: Secondary | ICD-10-CM | POA: Insufficient documentation

## 2017-01-15 MED ORDER — TRAMADOL HCL 50 MG PO TABS
50.0000 mg | ORAL_TABLET | Freq: Once | ORAL | Status: AC
  Administered 2017-01-15: 50 mg via ORAL
  Filled 2017-01-15: qty 1

## 2017-01-15 MED ORDER — CYCLOBENZAPRINE HCL 10 MG PO TABS
10.0000 mg | ORAL_TABLET | Freq: Three times a day (TID) | ORAL | 0 refills | Status: AC | PRN
Start: 2017-01-15 — End: ?

## 2017-01-15 MED ORDER — TRAMADOL HCL 50 MG PO TABS
50.0000 mg | ORAL_TABLET | Freq: Four times a day (QID) | ORAL | 0 refills | Status: DC | PRN
Start: 2017-01-15 — End: 2017-04-12

## 2017-01-15 NOTE — ED Provider Notes (Signed)
[facility]-EMERGENCY DEPT Provider Note   CSN: [id number] Arrival date & time: 01/15/17  0034  By signing my name below, I, Linna Darner, attest that this documentation has been prepared under the direction and in the presence of physician practitioner, Dione Booze, MD. Electronically Signed: Linna Darner, Scribe. 01/15/2017. 1:16 AM.  History   Chief Complaint Chief Complaint  Patient presents with  . Foot Injury    The history is provided by the patient. No language interpreter was used.     HPI Comments: David Holder is a 26 y.o. male who presents to the Emergency Department complaining of constant pain to the plantar surface of his left foot beginning two days ago. He states he slipped while walking out of a gas station and lost his balance but did not fall. He reports he had to bear most of his weight on his left foot to maintain his balance. Pt reports 8/10 pain to his left foot, most significant underneath his left toe, with weight bearing/ambulation. He also notes some associated lower back pain and stiffness. He has applied ice to his left foot and taken ibuprofen with no improvement of his pain. He has a h/o bone spur in his left heel. Pt denies swelling, numbness/tingling, or any other associated symptoms. No podiatrist or orthopedist and he notes he does not currently have health insurance.  Past Medical History:  Diagnosis Date  . Heel spur     There are no active problems to display for this patient.   Past Surgical History:  Procedure Laterality Date  . TONSILLECTOMY         Home Medications    Prior to Admission medications   Medication Sig Start Date End Date Taking? Authorizing Provider  ibuprofen (ADVIL,MOTRIN) 800 MG tablet Take 1 tablet (800 mg total) by mouth 3 (three) times daily. 10/28/16   Danelle Berry, PA-C  naproxen (NAPROSYN) 500 MG tablet Take 1 tablet (500 mg total) by mouth 2 (two) times daily. Patient not taking: Reported on 07/01/2016 05/22/16    Barrett Henle, PA-C  penicillin v potassium (VEETID) 500 MG tablet Take 1 tablet (500 mg total) by mouth 4 (four) times daily. Patient not taking: Reported on 05/21/2016 04/13/16   Everlene Farrier, PA-C  predniSONE (DELTASONE) 20 MG tablet Take 2 tablets (40 mg total) by mouth daily. 10/28/16   Danelle Berry, PA-C  predniSONE (STERAPRED UNI-PAK 21 TAB) 10 MG (21) TBPK tablet Take 1 tablet (10 mg total) by mouth daily. Take 6 tabs by mouth daily  for 2 days, then 5 tabs for 2 days, then 4 tabs for 2 days, then 3 tabs for 2 days, 2 tabs for 2 days, then 1 tab by mouth daily for 2 days 07/01/16   Lorre Nick, MD  traMADol (ULTRAM) 50 MG tablet Take 1 tablet (50 mg total) by mouth every 6 (six) hours as needed. 07/01/16   Lorre Nick, MD  traMADol (ULTRAM) 50 MG tablet Take 1 tablet (50 mg total) by mouth every 6 (six) hours as needed. 07/15/16   Earley Favor, NP  traMADol (ULTRAM) 50 MG tablet Take 1 tablet (50 mg total) by mouth every 6 (six) hours as needed. 10/28/16   Danelle Berry, PA-C    Family History History reviewed. No pertinent family history.  Social History Social History  Substance Use Topics  . Smoking status: Current Some Day Smoker    Packs/day: 0.50    Types: Cigarettes  . Smokeless tobacco: Current User  . Alcohol use  No     Allergies   Haloperidol and Codeine   Review of Systems Review of Systems  Musculoskeletal: Positive for back pain and myalgias. Negative for joint swelling.  Neurological: Negative for numbness.  All other systems reviewed and are negative.   Physical Exam Updated Vital Signs BP 132/80 (BP Location: Right Arm)   Pulse 99   Temp 98.6 F (37 C) (Oral)   Resp 18   Ht 5\' 9"  (1.753 m)   Wt 300 lb (136.1 kg)   SpO2 99%   BMI 44.30 kg/m   Physical Exam  Constitutional: He is oriented to person, place, and time. He appears well-developed and well-nourished.  HENT:  Head: Normocephalic and atraumatic.  Eyes: EOM are normal. Pupils are  equal, round, and reactive to light.  Neck: Normal range of motion. Neck supple. No JVD present.  Cardiovascular: Normal rate, regular rhythm and normal heart sounds.   No murmur heard. Pulmonary/Chest: Effort normal and breath sounds normal. He has no wheezes. He has no rales. He exhibits no tenderness.  Abdominal: Soft. Bowel sounds are normal. He exhibits no distension and no mass. There is no tenderness.  Musculoskeletal: Normal range of motion. He exhibits tenderness. He exhibits no edema or deformity.  Mild tenderness across lower back. Left foot has tenderness of the first toe without swelling or deformity. No tenderness at the MTP joint.  Lymphadenopathy:    He has no cervical adenopathy.  Neurological: He is alert and oriented to person, place, and time. No cranial nerve deficit. He exhibits normal muscle tone. Coordination normal.  Skin: Skin is warm and dry. No rash noted.  Psychiatric: He has a normal mood and affect. His behavior is normal. Judgment and thought content normal.  Nursing note and vitals reviewed.    ED Treatments / Results   Radiology Dg Foot Complete Left  Result Date: 01/15/2017 CLINICAL DATA:  Left great toe pain after slipping on ice cream and falling in a parking lot 2 days ago. EXAM: LEFT FOOT - COMPLETE 3+ VIEW COMPARISON:  None. FINDINGS: Negative for fracture, dislocation or radiopaque foreign body. Plantar calcaneal spur incidentally noted. IMPRESSION: Negative. Electronically Signed   By: Ellery Plunk M.D.   On: 01/15/2017 01:19    Procedures Procedures (including critical care time)  DIAGNOSTIC STUDIES: Oxygen Saturation is 99% on RA, normal by my interpretation.    COORDINATION OF CARE: 1:23 AM Discussed treatment plan with pt at bedside and pt agreed to plan.  Medications Ordered in ED Medications  traMADol (ULTRAM) tablet 50 mg (not administered)     Initial Impression / Assessment and Plan / ED Course  I have reviewed the triage  vital signs and the nursing notes.  Pertinent imaging results that were available during my care of the patient were reviewed by me and considered in my medical decision making (see chart for details).  Left first toe injury which appears to be a minor sprain. X-rays are negative for fracture. Old records are reviewed, and he has multiple ED visits for pain and left foot-many related to bone spur. Back pain appears to be muscular strain related to his near fall. He is given a postop shoe and crutches to use as needed and is referred to orthopedics for follow-up. Prescriptions given for tramadol and cyclobenzaprine. His record on the [place] controlled substance reporting website was reviewed showing to prescriptions for tramadol in the last month but no other narcotic prescriptions in the last 6 months.  Final  Clinical Impressions(s) / ED Diagnoses   Final diagnoses:  Sprain of left great toe, initial encounter  Strain of lumbar region, initial encounter    New Prescriptions New Prescriptions   CYCLOBENZAPRINE (FLEXERIL) 10 MG TABLET    Take 1 tablet (10 mg total) by mouth 3 (three) times daily as needed for muscle spasms.   I personally performed the services described in this documentation, which was scribed in my presence. The recorded information has been reviewed and is accurate.      Dione Boozeavid Darrow Barreiro, MD 01/15/17 [phone]

## 2017-01-15 NOTE — ED Triage Notes (Addendum)
Patient complaining of injury to left foot. Patient was sheetz and all most fell because he slipped on something. This happened on Wednesday. Patient also feels a tightness in his back.

## 2017-01-15 NOTE — Discharge Instructions (Signed)
Apply ice several times a day. Wear post-op shoe as needed. Use crutches as needed. Take ibuprofen or naproxen for pain.

## 2017-04-12 ENCOUNTER — Encounter (HOSPITAL_COMMUNITY): Payer: Self-pay | Admitting: Emergency Medicine

## 2017-04-12 ENCOUNTER — Emergency Department (HOSPITAL_COMMUNITY)
Admission: EM | Admit: 2017-04-12 | Discharge: 2017-04-12 | Disposition: A | Discharge status: Home / Self Care | Payer: Self-pay | Attending: Emergency Medicine | Admitting: Emergency Medicine

## 2017-04-12 DIAGNOSIS — M79672 Pain in left foot: Secondary | ICD-10-CM

## 2017-04-12 DIAGNOSIS — F17210 Nicotine dependence, cigarettes, uncomplicated: Secondary | ICD-10-CM | POA: Insufficient documentation

## 2017-04-12 DIAGNOSIS — Z79899 Other long term (current) drug therapy: Secondary | ICD-10-CM | POA: Insufficient documentation

## 2017-04-12 DIAGNOSIS — M7732 Calcaneal spur, left foot: Secondary | ICD-10-CM | POA: Insufficient documentation

## 2017-04-12 MED ORDER — TRAMADOL HCL 50 MG PO TABS
50.0000 mg | ORAL_TABLET | Freq: Once | ORAL | Status: AC
  Administered 2017-04-12: 50 mg via ORAL
  Filled 2017-04-12: qty 1

## 2017-04-12 MED ORDER — PREDNISONE 20 MG PO TABS
60.0000 mg | ORAL_TABLET | Freq: Once | ORAL | Status: AC
  Administered 2017-04-12: 60 mg via ORAL
  Filled 2017-04-12: qty 3

## 2017-04-12 MED ORDER — TRAMADOL HCL 50 MG PO TABS: 50.0000 mg | ORAL_TABLET | Freq: Two times a day (BID) | ORAL | 0 refills | Status: DC | PRN

## 2017-04-12 MED ORDER — PREDNISONE 10 MG PO TABS: 40.0000 mg | ORAL_TABLET | Freq: Every day | ORAL | 0 refills | Status: AC

## 2017-04-12 NOTE — ED Triage Notes (Signed)
Pt states he has bone spurs in his left heel and they flare up every now and again  Pt states tonight the pain is bad and it is very painful to walk or stand

## 2017-04-12 NOTE — ED Provider Notes (Signed)
[facility] Provider Note   CSN: [id number] Arrival date & time: 04/12/17  0435     History   Chief Complaint Chief Complaint  Patient presents with  . Foot Pain    HPI David Holder is a 26 y.o. male with a hx Of no major medical problems presents to the Emergency Department complaining of gradual, persistent, progressively worsening left heel pain onset 3 days ago. Associated symptoms include cramping of his entire foot when he walks.  Patient reports nothing makes symptoms better despite daily usage of ibuprofen. He reports unknown heel spur which causes pain like this on occasion. He reports that when this happens he takes tramadol and prednisone with moderate relief.  He does not currently have any tramadol. He reports he does still have a boot at home but has not been using it. He denies falls, known injury, puncture wounds, fevers or chills.  The history is provided by the patient and medical records. No language interpreter was used.    Past Medical History:  Diagnosis Date  . Heel spur     There are no active problems to display for this patient.   Past Surgical History:  Procedure Laterality Date  . TONSILLECTOMY         Home Medications    Prior to Admission medications   Medication Sig Start Date End Date Taking? Authorizing Provider  cyclobenzaprine (FLEXERIL) 10 MG tablet Take 1 tablet (10 mg total) by mouth 3 (three) times daily as needed for muscle spasms. 01/15/17   Dione Booze, MD  ibuprofen (ADVIL,MOTRIN) 800 MG tablet Take 1 tablet (800 mg total) by mouth 3 (three) times daily. 10/28/16   Danelle Berry, PA-C  predniSONE (DELTASONE) 20 MG tablet Take 2 tablets (40 mg total) by mouth daily. 10/28/16   Danelle Berry, PA-C  predniSONE (STERAPRED UNI-PAK 21 TAB) 10 MG (21) TBPK tablet Take 1 tablet (10 mg total) by mouth daily. Take 6 tabs by mouth daily  for 2 days, then 5 tabs for 2 days, then 4 tabs for 2 days, then 3 tabs for 2 days, 2 tabs for 2  days, then 1 tab by mouth daily for 2 days 07/01/16   Lorre Nick, MD  traMADol (ULTRAM) 50 MG tablet Take 1 tablet (50 mg total) by mouth every 6 (six) hours as needed. 07/15/16   Earley Favor, NP  traMADol (ULTRAM) 50 MG tablet Take 1 tablet (50 mg total) by mouth every 6 (six) hours as needed. 10/28/16   Danelle Berry, PA-C  traMADol (ULTRAM) 50 MG tablet Take 1 tablet (50 mg total) by mouth every 6 (six) hours as needed. 01/15/17   Dione Booze, MD    Family History History reviewed. No pertinent family history.  Social History Social History  Substance Use Topics  . Smoking status: Current Some Day Smoker    Packs/day: 0.50    Types: Cigarettes  . Smokeless tobacco: Current User  . Alcohol use No     Allergies   Haloperidol and Codeine   Review of Systems Review of Systems  Musculoskeletal: Positive for arthralgias and gait problem ( 2/2 pain).  Skin: Negative for rash.     Physical Exam Updated Vital Signs BP (!) 150/131 (BP Location: Left Arm)   Pulse (!) 109   Temp 98.1 F (36.7 C) (Oral)   Resp 18   Ht  (1.753 m)   Wt 136.1 kg   SpO2 100%   BMI 44.30 kg/m   Physical Exam  Constitutional: He appears well-developed and well-nourished. No distress.  HENT:  Head: Normocephalic and atraumatic.  Eyes: Conjunctivae are normal.  Neck: Normal range of motion.  Cardiovascular: Regular rhythm and intact distal pulses.  Tachycardia present.   Pulses:      Radial pulses are 2+ on the right side, and 2+ on the left side.       Dorsalis pedis pulses are 2+ on the left side.  Capillary refill < 3 sec  Pulmonary/Chest: Effort normal and breath sounds normal.  Musculoskeletal: He exhibits tenderness. He exhibits no edema.  Left foot and ankle without ecchymosis or swelling. No peripheral edema. Full range of motion of the left knee, ankle and toes. Sensation intact to the left lower extremity. Strength 5/5 with dorsiflexion and plantar flexion. Significant tenderness to  palpation over the left heel. No erythema, induration, open wound or increased warmth.  Neurological: He is alert. Coordination normal.  Skin: Skin is warm and dry. He is not diaphoretic.  No tenting of the skin  Psychiatric: He has a normal mood and affect.  Nursing note and vitals reviewed.    ED Treatments / Results   Procedures Procedures (including critical care time)  Medications Ordered in ED Medications  predniSONE (DELTASONE) tablet 60 mg (not administered)  traMADol (ULTRAM) tablet 50 mg (not administered)     Initial Impression / Assessment and Plan / ED Course  I have reviewed the triage vital signs and the nursing notes.  Pertinent labs & imaging results that were available during my care of the patient were reviewed by me and considered in my medical decision making (see chart for details).     Patient presents with left heel pain. Record review shows several x-rays of the left foot which do indeed indicate a heel spur. Patient appears uncomfortable. He reports prednisone and tramadol have worked in the past. Will give same today. Also discussed the importance of follow-up with podiatry. Patient and significant other at bedside report understanding.  Final Clinical Impressions(s) / ED Diagnoses   Final diagnoses:  Foot pain, left  Calcaneal spur of left foot    New Prescriptions New Prescriptions   No medications on file     Dierdre Forth, PA-C 04/12/17 0515    Geoffery Lyons, MD 04/12/17 [phone]

## 2017-11-15 ENCOUNTER — Emergency Department (HOSPITAL_COMMUNITY): Admission: EM | Admit: 2017-11-15 | Discharge: 2017-11-15 | Discharge status: Against Medical Advice | Payer: Self-pay

## 2018-02-16 IMAGING — CR DG FOOT COMPLETE 3+V*L*
3 series · 3 of 3 positions shown · non-contrast
Comparison: None.

CLINICAL DATA: Left great toe pain after slipping on ice cream and
falling in a parking lot 2 days ago.

EXAM:
LEFT FOOT - COMPLETE 3+ VIEW

[x foot ap left]
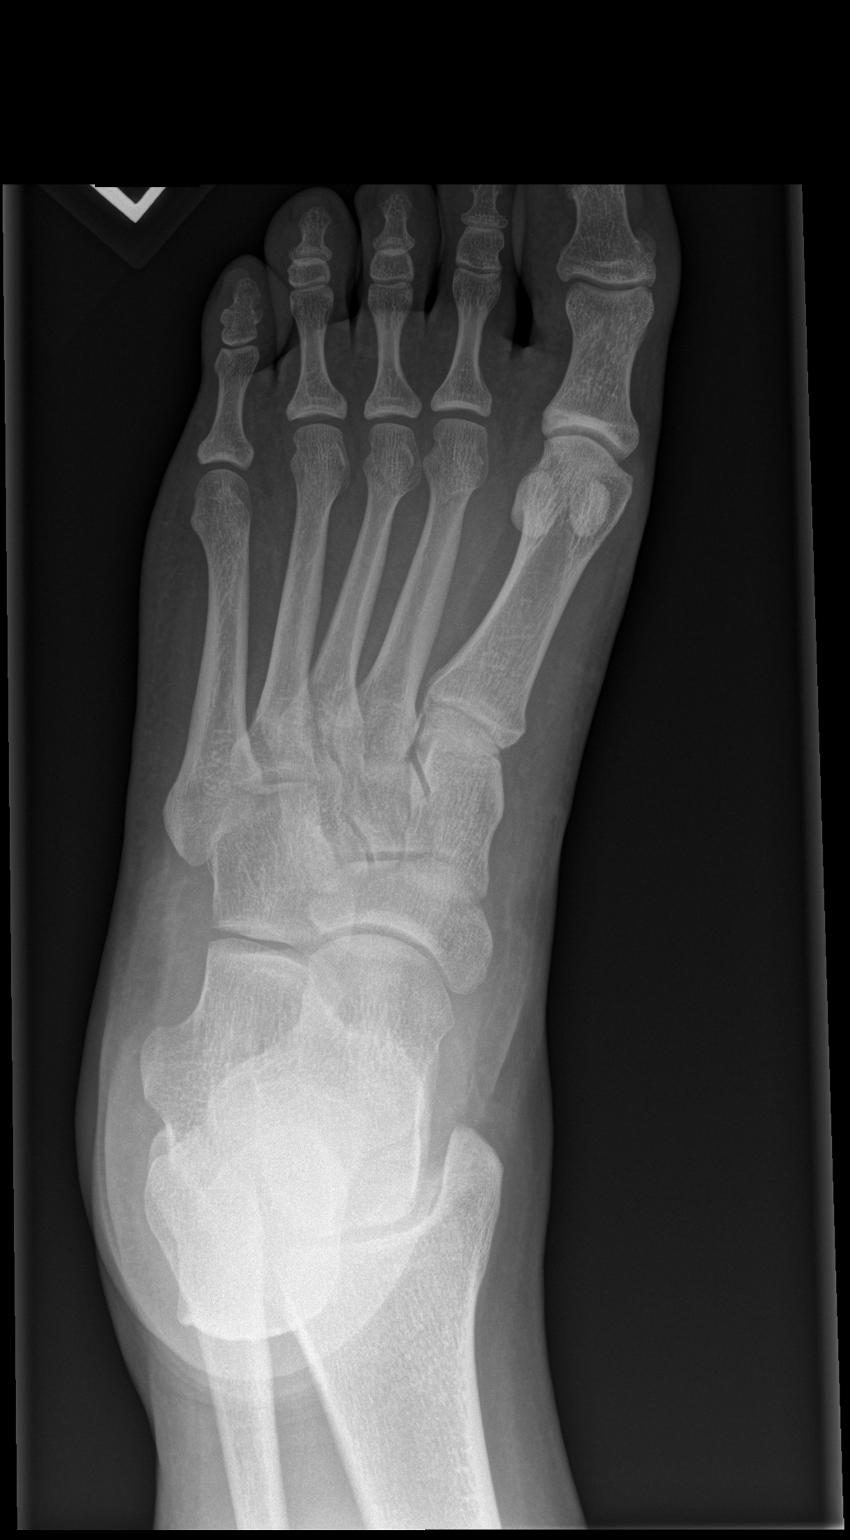

[x foot obl left]
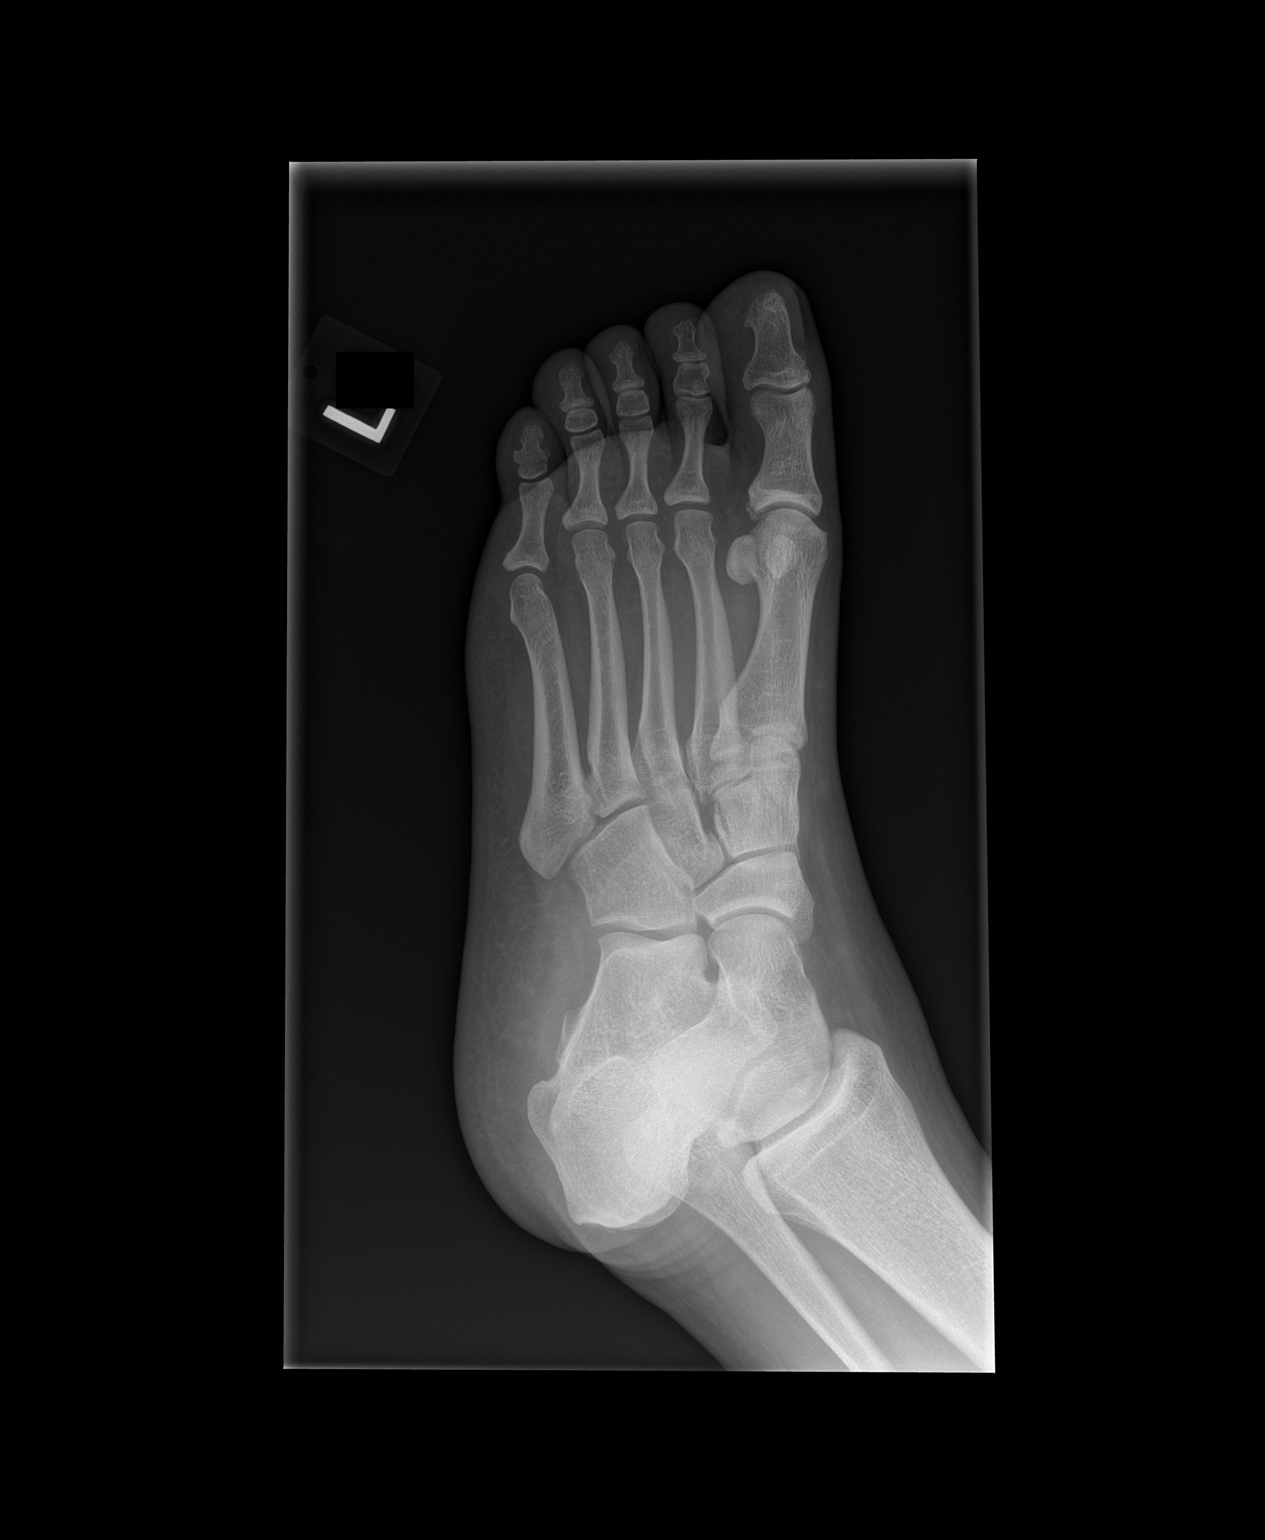

[x foot lat left]
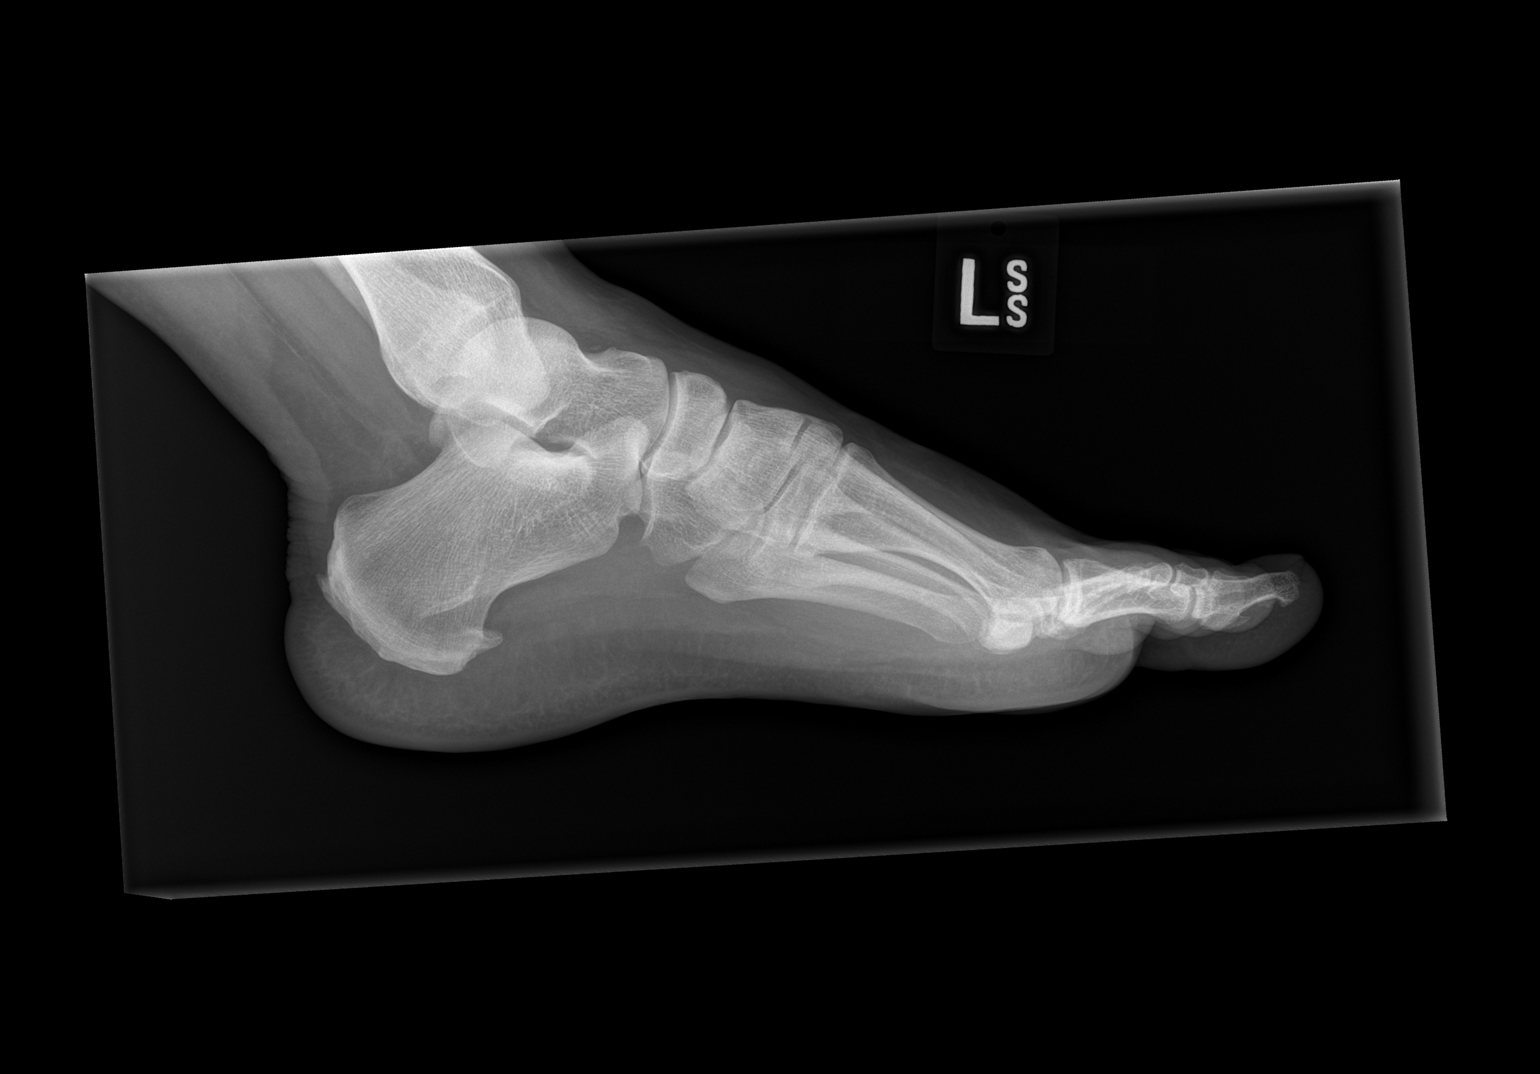

[3 of 3 positions shown; findings below may reference images not displayed]

FINDINGS: Negative for fracture, dislocation or radiopaque foreign body.
Plantar calcaneal spur incidentally noted.
IMPRESSION: Negative.

## 2018-02-19 ENCOUNTER — Emergency Department
Admission: EM | Admit: 2018-02-19 | Discharge: 2018-02-19 | Disposition: A | Discharge status: Home / Self Care | Payer: Self-pay | Attending: Emergency Medicine | Admitting: Emergency Medicine

## 2018-02-19 ENCOUNTER — Emergency Department: Payer: Self-pay

## 2018-02-19 DIAGNOSIS — Z79899 Other long term (current) drug therapy: Secondary | ICD-10-CM | POA: Insufficient documentation

## 2018-02-19 DIAGNOSIS — M722 Plantar fascial fibromatosis: Secondary | ICD-10-CM | POA: Insufficient documentation

## 2018-02-19 DIAGNOSIS — F17210 Nicotine dependence, cigarettes, uncomplicated: Secondary | ICD-10-CM | POA: Insufficient documentation

## 2018-02-19 MED ORDER — TRAMADOL HCL 50 MG PO TABS: 50.0000 mg | ORAL_TABLET | Freq: Four times a day (QID) | ORAL | 0 refills | Status: AC | PRN

## 2018-02-19 MED ORDER — IBUPROFEN 800 MG PO TABS: 800.0000 mg | ORAL_TABLET | Freq: Three times a day (TID) | ORAL | 1 refills | Status: AC | PRN

## 2018-02-19 MED ORDER — TRAMADOL HCL 50 MG PO TABS
50.0000 mg | ORAL_TABLET | Freq: Once | ORAL | Status: AC
  Administered 2018-02-19: 50 mg via ORAL
  Filled 2018-02-19: qty 1

## 2018-02-19 MED ORDER — IBUPROFEN 800 MG PO TABS
800.0000 mg | ORAL_TABLET | Freq: Once | ORAL | Status: AC
  Administered 2018-02-19: 800 mg via ORAL
  Filled 2018-02-19: qty 1

## 2018-02-19 NOTE — ED Triage Notes (Signed)
Patient reports hx of bone spur to left heel. Patient c/o pain to area.

## 2018-02-19 NOTE — ED Notes (Signed)

## 2018-02-19 NOTE — ED Notes (Signed)
Patient states he knows he has a bone spur on left heel so he has been taking prednisone at home to help with inflammation and has been wearing ortho shoe.

## 2018-02-19 NOTE — ED Notes (Signed)
Pt states left foot pain in the heel x2 weeks, patient states pressure radiates throughout foot. Pt reports difficulty ambulating d/t pain.

## 2018-02-20 NOTE — ED Provider Notes (Signed)
[facility] Emergency Department Provider Note  ____________________________________________  Time seen: Approximately 12:15 AM  I have reviewed the triage vital signs and the nursing notes.   HISTORY  Chief Complaint Foot Pain    HPI David Holder is a 27 y.o. male resents to the emergency department with 10 out of 10 left heel pain worsened with ambulation first thing in the morning.  Patient has a history of plantar fasciitis.  Patient reports that he does not currently have insurance and is requesting a prescription for tramadol and ibuprofen 800s.  He denies any falls or mechanisms of trauma.  Past Medical History:  Diagnosis Date  . Heel spur     There are no active problems to display for this patient.   Past Surgical History:  Procedure Laterality Date  . TONSILLECTOMY      Prior to Admission medications   Medication Sig Start Date End Date Taking? Authorizing Provider  cyclobenzaprine (FLEXERIL) 10 MG tablet Take 1 tablet (10 mg total) by mouth 3 (three) times daily as needed for muscle spasms. 01/15/17   Dione Booze, MD  ibuprofen (ADVIL,MOTRIN) 800 MG tablet Take 1 tablet (800 mg total) by mouth every 8 (eight) hours as needed for up to 5 days. 02/19/18 02/24/18  Orvil Feil, PA-C  predniSONE (DELTASONE) 10 MG tablet Take 4 tablets (40 mg total) by mouth daily. 04/12/17   Muthersbaugh, Dahlia Client, PA-C  traMADol (ULTRAM) 50 MG tablet Take 1 tablet (50 mg total) by mouth every 6 (six) hours as needed for up to 5 days. 02/19/18 02/24/18  Orvil Feil, PA-C    Allergies Haloperidol and Codeine  No family history on file.  Social History Social History   Tobacco Use  . Smoking status: Current Some Day Smoker    Packs/day: 0.50    Types: Cigarettes  . Smokeless tobacco: Current User  Substance Use Topics  . Alcohol use: No  . Drug use: No     Review of Systems  Constitutional: No fever/chills Eyes: No visual changes. No  discharge ENT: No upper respiratory complaints. Cardiovascular: no chest pain. Respiratory: no cough. No SOB. Gastrointestinal: No abdominal pain.  No nausea, no vomiting.  No diarrhea.  No constipation. Musculoskeletal: Patient has left heel pain.  Skin: Negative for rash, abrasions, lacerations, ecchymosis. Neurological: Negative for headaches, focal weakness or numbness.  ____________________________________________   PHYSICAL EXAM:  VITAL SIGNS: ED Triage Vitals  Enc Vitals Group     BP 02/19/18 2120 124/75     Pulse Rate 02/19/18 2120 (!) 104     Resp 02/19/18 2120 18     Temp 02/19/18 2120 98.6 F (37 C)     Temp src --      SpO2 02/19/18 2120 98 %     Weight 02/19/18 2118 300 lb (136.1 kg)     Height --      Head Circumference --      Peak Flow --      Pain Score 02/19/18 2118 6     Pain Loc --      Pain Edu? --      Excl. in GC? --      Constitutional: Alert and oriented. Well appearing and in no acute distress. Eyes: Conjunctivae are normal. PERRL. EOMI. Head: Atraumatic. Cardiovascular: Normal rate, regular rhythm. Normal S1 and S2.  Good peripheral circulation. Respiratory: Normal respiratory effort without tachypnea or retractions. Lungs CTAB. Good air entry to the bases with no decreased or absent  breath sounds. Musculoskeletal: Full range of motion to all extremities. No gross deformities appreciated.  Patient has tenderness elicited with palpation of the insertion for the left plantar fascia. Neurologic:  Normal speech and language. No gross focal neurologic deficits are appreciated.  Skin:  Skin is warm, dry and intact. No rash noted.   ____________________________________________   LABS (all labs ordered are listed, but only abnormal results are displayed)  Labs Reviewed - No data to display ____________________________________________  EKG   ____________________________________________  RADIOLOGY Geraldo PitterI, Esperanza Madrazo M Braylin Xu, personally viewed and  evaluated these images (plain radiographs) as part of my medical decision making, as well as reviewing the written report by the radiologist.  Dg Foot Complete Left  Result Date: 02/19/2018 CLINICAL DATA:  Foot pain, initial encounter EXAM: LEFT FOOT - COMPLETE 3+ VIEW COMPARISON:  None. FINDINGS: Small calcaneal spur is noted. No acute fracture or dislocation is noted. No soft tissue abnormality is noted. IMPRESSION: No acute abnormality noted. Electronically Signed   By: Alcide CleverMark  Lukens M.D.   On: 02/19/2018 22:16    ____________________________________________    PROCEDURES  Procedure(s) performed:    Procedures    Medications  traMADol (ULTRAM) tablet 50 mg (50 mg Oral Given 02/19/18 2328)  ibuprofen (ADVIL,MOTRIN) tablet 800 mg (800 mg Oral Given 02/19/18 2328)     ____________________________________________   INITIAL IMPRESSION / ASSESSMENT AND PLAN / ED COURSE  Pertinent labs & imaging results that were available during my care of the patient were reviewed by me and considered in my medical decision making (see chart for details).  Review of the [place] CSRS was performed in accordance of the [facility] prior to dispensing any controlled drugs.     Assessment and plan Plantar fasciitis Patient presents to the emergency department with pain to palpation over the left plantar fascia.  Patient is requesting prescription for tramadol and ibuprofen.  Patient's request was granted.  X-ray examination reveals no acute abnormality.    ____________________________________________  FINAL CLINICAL IMPRESSION(S) / ED DIAGNOSES  Final diagnoses:  Plantar fasciitis of left foot      NEW MEDICATIONS STARTED DURING THIS VISIT:  ED Discharge Orders        Ordered    ibuprofen (ADVIL,MOTRIN) 800 MG tablet  Every 8 hours PRN     02/19/18 2319    traMADol (ULTRAM) 50 MG tablet  Every 6 hours PRN     02/19/18 2319          This chart was dictated using voice recognition  software/Dragon. Despite best efforts to proofread, errors can occur which can change the meaning. Any change was purely unintentional.    Orvil FeilWoods, Romani Wilbon M, PA-C 02/20/18 0017    Governor RooksLord, Rebecca, MD 02/22/18 [phone]

## 2019-03-23 IMAGING — CR DG FOOT COMPLETE 3+V*L*
3 series · 3 of 3 positions shown · non-contrast
Comparison: None.

CLINICAL DATA: Foot pain, initial encounter

EXAM:
LEFT FOOT - COMPLETE 3+ VIEW

[foot ap]
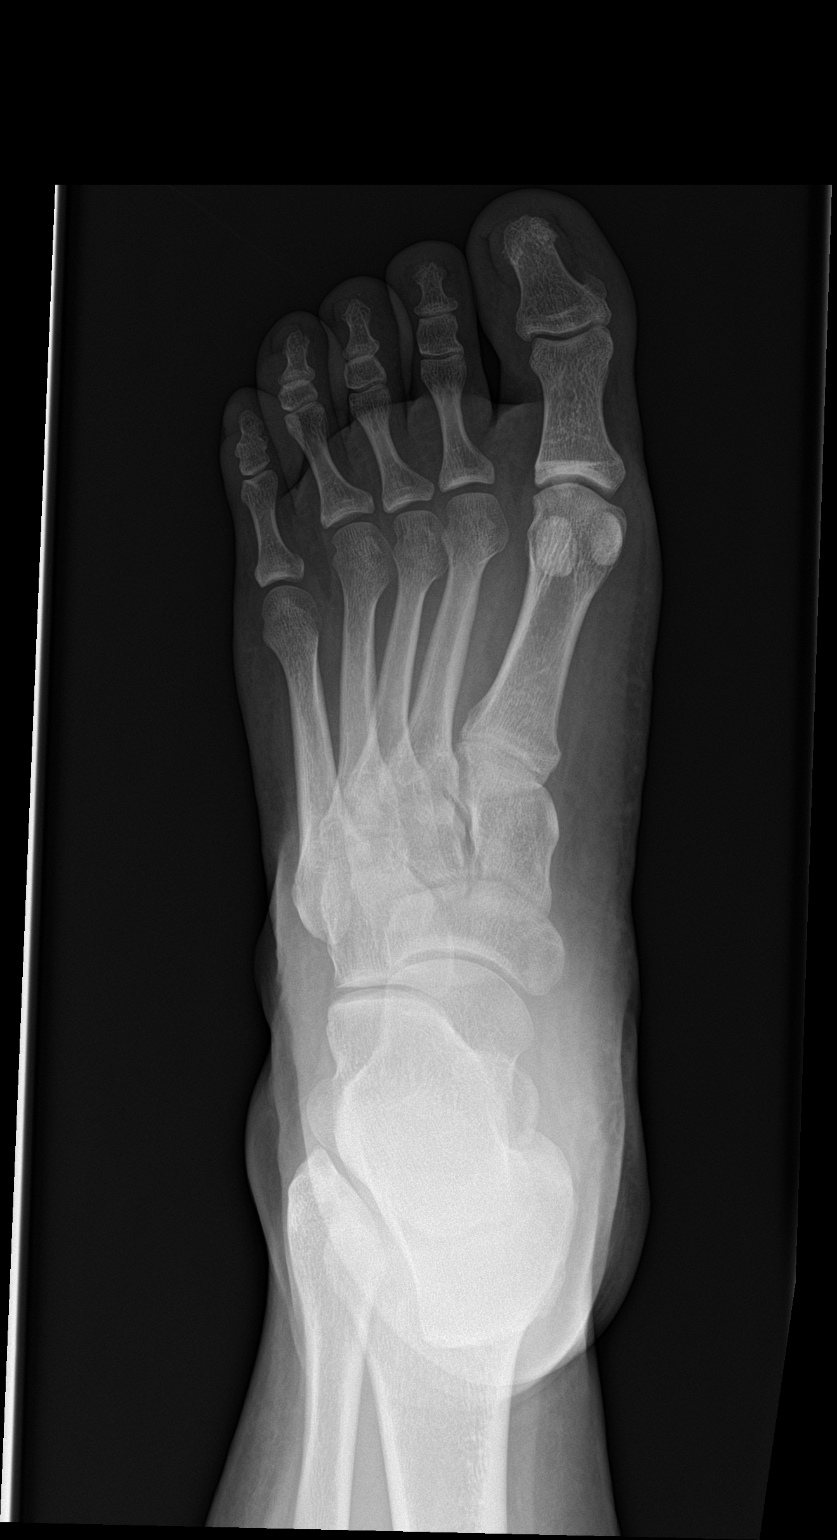

[foot lat]
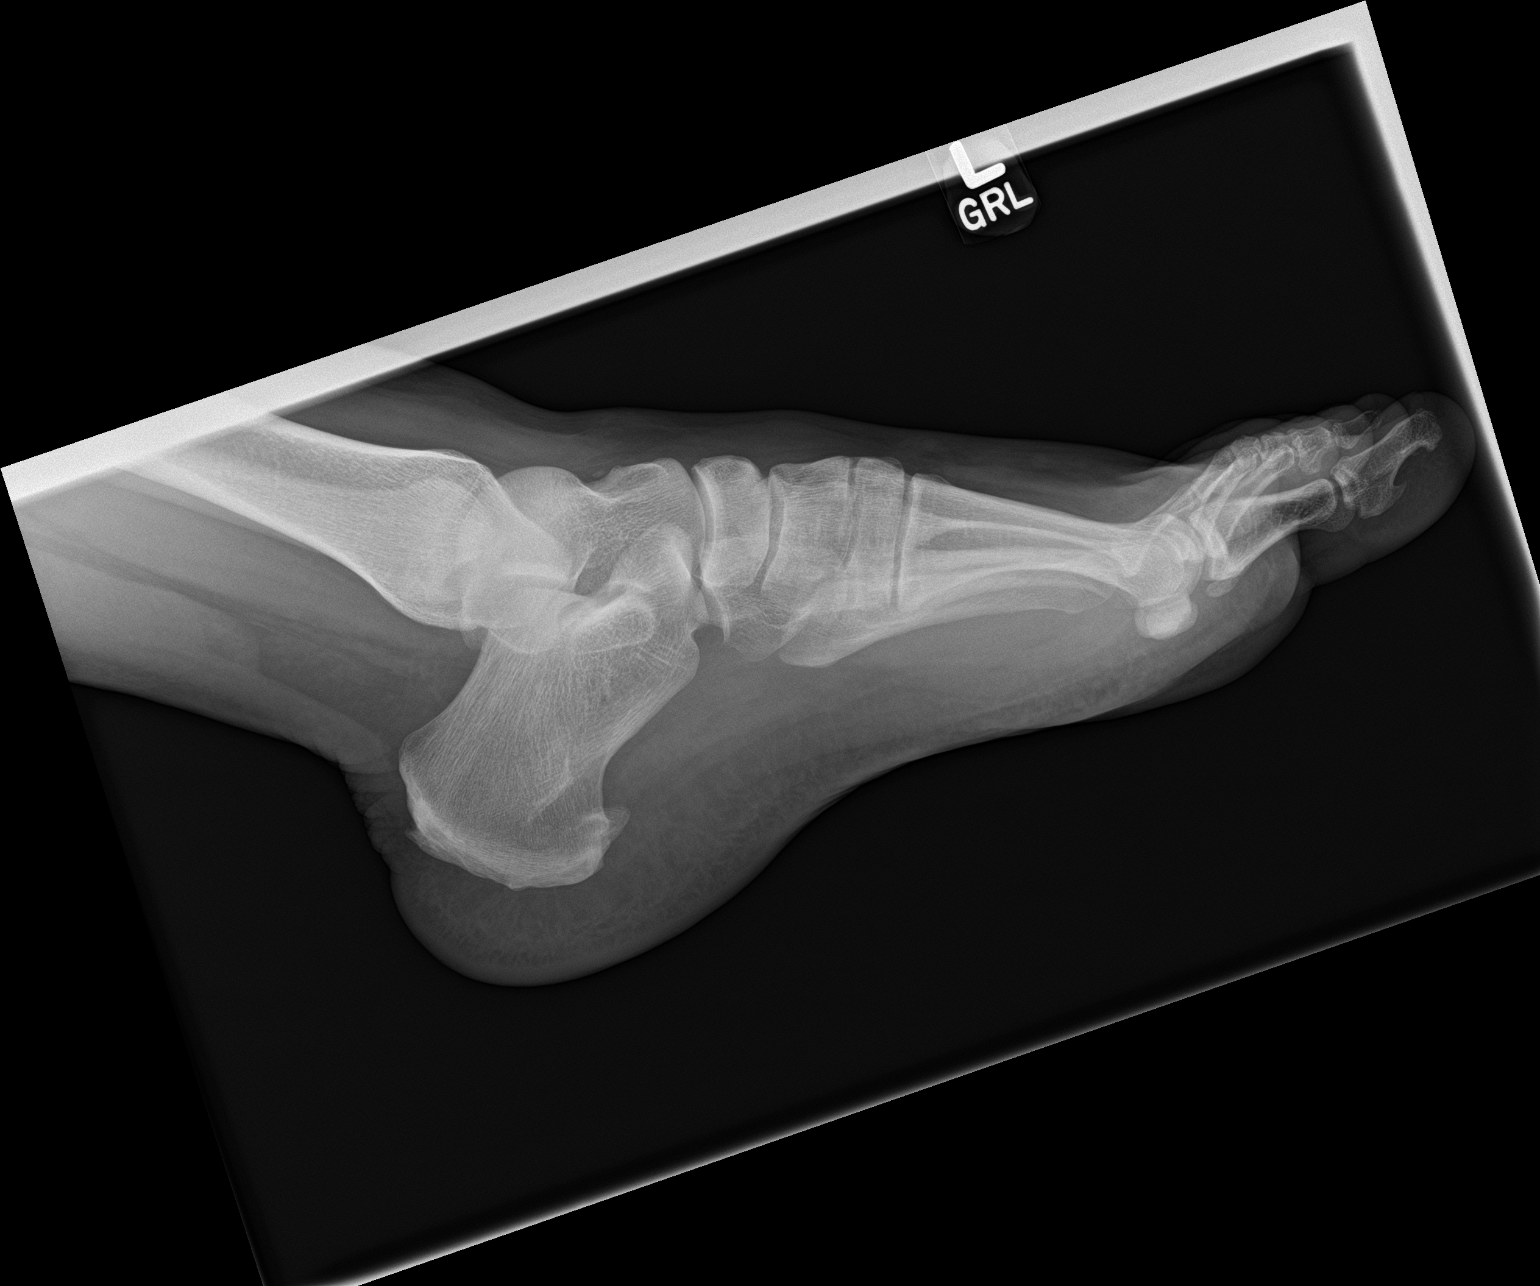

[foot obl]
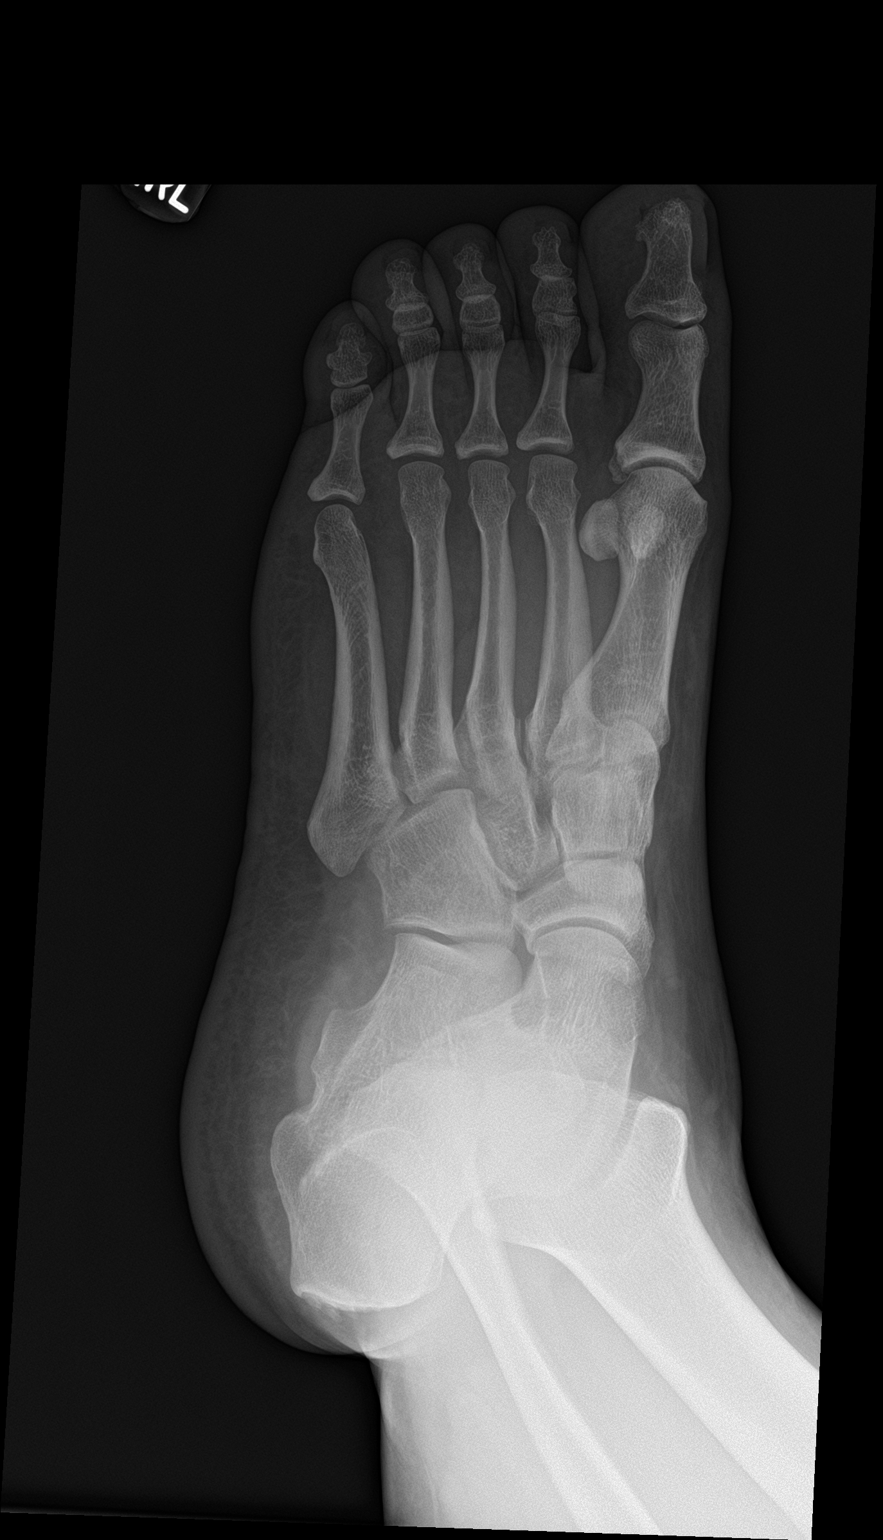

[3 of 3 positions shown; findings below may reference images not displayed]

FINDINGS: Small calcaneal spur is noted. No acute fracture or dislocation is
noted. No soft tissue abnormality is noted.
IMPRESSION: No acute abnormality noted.

## 2019-11-23 DIAGNOSIS — K047 Periapical abscess without sinus: Secondary | ICD-10-CM

## 2019-11-23 NOTE — ED Notes (Signed)
I have reviewed discharge instructions with the patient.  The patient verbalized understanding.    Patient left ED via Discharge Method: ambulatory to Home with friend.    Opportunity for questions and clarification provided.       Patient given 2 scripts.         To continue your aftercare when you leave the hospital, you may receive an automated call from our care team to check in on how you are doing.  This is a free service and part of our promise to provide the best care and service to meet your aftercare needs.??? If you have questions, or wish to unsubscribe from this service please call [phone].  Thank you for Choosing our [facility].

## 2019-11-23 NOTE — ED Triage Notes (Signed)
PT c/o upper right dental pain.

## 2019-11-23 NOTE — ED Provider Notes (Addendum)
28 year old male presenting for dental pain.    The history is provided by the patient.   Dental Pain    This is a recurrent problem. The current episode started more than 1 week ago. The problem occurs constantly. The problem has not changed since onset.The pain is located in the left upper and left lower mouth.The quality of the pain is throbbing.  The pain is at a severity of 5/10. The pain is moderate. Associated symptoms include swelling and gum redness.There was no vomiting, no nausea, no fever, no chest pain, no shortness of breath, no headaches and no drainage. The treatment provided no relief. The patient has no cardiac history.       No past medical history on file.    No past surgical history on file.      No family history on file.    Social History     Socioeconomic History   ??? Marital status: SINGLE     Spouse name: Not on file   ??? Number of children: Not on file   ??? Years of education: Not on file   ??? Highest education level: Not on file   Occupational History   ??? Not on file   Social Needs   ??? Financial resource strain: Not on file   ??? Food insecurity     Worry: Not on file     Inability: Not on file   ??? Transportation needs     Medical: Not on file     Non-medical: Not on file   Tobacco Use   ??? Smoking status: Not on file   Substance and Sexual Activity   ??? Alcohol use: Not on file   ??? Drug use: Not on file   ??? Sexual activity: Not on file   Lifestyle   ??? Physical activity     Days per week: Not on file     Minutes per session: Not on file   ??? Stress: Not on file   Relationships   ??? Social Product manager on phone: Not on file     Gets together: Not on file     Attends religious service: Not on file     Active member of club or organization: Not on file     Attends meetings of clubs or organizations: Not on file     Relationship status: Not on file   ??? Intimate partner violence     Fear of current or ex partner: Not on file     Emotionally abused: Not on file      Physically abused: Not on file     Forced sexual activity: Not on file   Other Topics Concern   ??? Not on file   Social History Narrative   ??? Not on file         ALLERGIES: Patient has no known allergies.    Review of Systems   HENT: Positive for dental problem.    All other systems reviewed and are negative.      Vitals:    11/23/19 2155 11/23/19 2208   BP: (!) 130/100    Pulse: 87    Resp: 18    Temp: 98.8 ??F (37.1 ??C)    SpO2: 97% 97%   Weight: 142.9 kg (315 lb)    Height: 5\' 9"  (1.753 m)             Physical Exam  Vitals signs and nursing note reviewed.  Constitutional:       Appearance: He is well-developed.   HENT:      Head: Normocephalic and atraumatic.      Mouth/Throat:     Eyes:      Extraocular Movements: Extraocular movements intact.      Conjunctiva/sclera: Conjunctivae normal.   Neck:      Musculoskeletal: Normal range of motion and neck supple.   Cardiovascular:      Heart sounds: Normal heart sounds.   Musculoskeletal: Normal range of motion.         General: No deformity.   Skin:     General: Skin is warm and dry.   Neurological:      Mental Status: He is alert and oriented to person, place, and time.      Cranial Nerves: No cranial nerve deficit.   Psychiatric:         Behavior: Behavior normal.          MDM  Number of Diagnoses or Management Options  Chronic dental infection:   Diagnosis management comments: 28 year old male presenting for dental pain.  Treating with antibiotics and a short course of pain medication.    I did review the patient's prescription monitoring database seems to have frequent visits to emergency departments although I cannot bring up his records in epic at this time for some reason.    Risk of Complications, Morbidity, and/or Mortality  Presenting problems: moderate  Diagnostic procedures: moderate  Management options: moderate  General comments: I personally reviewed the patient's vital signs, laboratory tests, and/or radiological findings.  I discussed these  findings with the patient and their significance.  I answered all questions and gave the patient clear return precautions.  The patient was discharged from the emergency department in stable condition    The patient with information regarding local free dental clinics        Patient Progress  Patient progress: improved         Procedures

## 2019-11-23 NOTE — ED Provider Notes (Signed)
ED Provider Notes by Synetta Fail, MD at 11/23/19 2226                Author: Synetta Fail, MD  Service: Emergency Medicine  Author Type: Physician       Filed: 11/23/19 2232  Date of Service: 11/23/19 2226  Status: Addendum          Editor: Synetta Fail, MD (Physician)          Related Notes: Original Note by Synetta Fail, MD (Physician) filed at 11/23/19 2229               28 year old male presenting for dental pain.      The history is provided by the patient.    Dental Pain     This  is a recurrent problem. The current episode started more than 1 week  ago. The problem occurs constantly. The problem has not changed since onset.The pain is located in the left upper and left lower mouth.The quality of the pain is throbbing.  The pain is at a severity of 5/10. The  pain is moderate. Associated symptoms include swelling  and gum redness.There was no vomiting, no nausea, no fever, no chest pain, no shortness of breath, no headaches and no drainage. The treatment provided no relief.  The patient has no cardiac history.          No past medical history on file.      No past surgical history on file.        No family history on file.        Social History          Socioeconomic History         ?  Marital status:  SINGLE              Spouse name:  Not on file         ?  Number of children:  Not on file     ?  Years of education:  Not on file     ?  Highest education level:  Not on file       Occupational History        ?  Not on file       Social Needs         ?  Financial resource strain:  Not on file        ?  Food insecurity              Worry:  Not on file         Inability:  Not on file        ?  Transportation needs              Medical:  Not on file         Non-medical:  Not on file       Tobacco Use         ?  Smoking status:  Not on file       Substance and Sexual Activity         ?  Alcohol use:  Not on file     ?  Drug use:  Not on file     ?  Sexual activity:  Not on file        Lifestyle        ?  Physical activity  Days per week:  Not on file         Minutes per session:  Not on file         ?  Stress:  Not on file       Relationships        ?  Social Engineer, manufacturing systemsconnections              Talks on phone:  Not on file         Gets together:  Not on file         Attends religious service:  Not on file         Active member of club or organization:  Not on file         Attends meetings of clubs or organizations:  Not on file         Relationship status:  Not on file        ?  Intimate partner violence              Fear of current or ex partner:  Not on file         Emotionally abused:  Not on file         Physically abused:  Not on file         Forced sexual activity:  Not on file        Other Topics  Concern        ?  Not on file       Social History Narrative        ?  Not on file              ALLERGIES: Patient has no known allergies.      Review of Systems    HENT: Positive for dental problem.     All other systems reviewed and are negative.           Vitals:           11/23/19 2155  11/23/19 2208         BP:  (!) 130/100       Pulse:  87       Resp:  18       Temp:  98.8 ??F (37.1 ??C)       SpO2:  97%  97%     Weight:  142.9 kg (315 lb)           Height:  5\' 9"  (1.753 m)                  Physical Exam   Vitals signs and nursing note reviewed.   Constitutional:        Appearance: He is well-developed.   HENT :       Head: Normocephalic and atraumatic.      Mouth/Throat:       Eyes :       Extraocular Movements: Extraocular movements intact.      Conjunctiva/sclera: Conjunctivae normal.    Neck:       Musculoskeletal: Normal range of motion and neck supple.   Cardiovascular :       Heart sounds: Normal heart sounds.   Musculoskeletal: Normal  range of motion.          General: No deformity.    Skin:      General: Skin is warm and dry.   Neurological :       Mental Status: He is alert and oriented  to person, place, and time.      Cranial Nerves: No cranial nerve deficit.    Psychiatric:          Behavior: Behavior normal.              MDM   Number of Diagnoses or Management Options   Chronic dental infection:    Diagnosis management comments: 28 year old male presenting for dental pain.  Treating with antibiotics and a short course of pain medication.      I did review the patient's prescription monitoring database seems to have frequent visits to emergency departments although I cannot bring up his records in epic at this time for some reason.      Risk of Complications, Morbidity, and/or Mortality   Presenting problems: moderate  Diagnostic procedures: moderate  Management options: moderate  General  comments: I personally reviewed the patient's vital signs, laboratory tests, and/or radiological findings.  I discussed these findings with the patient and their significance.  I answered all questions and gave the patient clear return precautions.  The  patient was discharged from the emergency department in stable condition      The patient with information regarding local free dental clinics            Patient Progress   Patient progress: improved             Procedures

## 2019-11-23 NOTE — ED Notes (Signed)
PT c/o upper right dental pain.

## 2019-11-23 NOTE — ED Notes (Signed)
I have reviewed discharge instructions with the patient.  The patient verbalized understanding.    Patient left ED via Discharge Method: ambulatory to Home with friend.    Opportunity for questions and clarification provided.       Patient given 2 scripts.         To continue your aftercare when you leave the hospital, you may receive an automated call from our care team to check in on how you are doing.  This is a free service and part of our promise to provide the best care and service to meet your aftercare needs." If you have questions, or wish to unsubscribe from this service please call [phone].  Thank you for Choosing our [facility].

## 2019-11-24 ENCOUNTER — Inpatient Hospital Stay
Admit: 2019-11-24 | Discharge: 2019-11-24 | Disposition: A | Discharge status: Home / Self Care | Attending: Emergency Medicine

## 2019-11-24 MED ORDER — KETOROLAC TROMETHAMINE 60 MG/2 ML IM
60 mg/2 mL | INTRAMUSCULAR | Status: AC
Start: 2019-11-24 — End: 2019-11-23
  Administered 2019-11-24: 04:00:00 via INTRAMUSCULAR

## 2019-11-24 MED ORDER — TRAMADOL 50 MG TAB
50 mg | ORAL | Status: AC
Start: 2019-11-24 — End: 2019-11-23
  Administered 2019-11-24: 04:00:00 via ORAL

## 2019-11-24 MED ORDER — TRAMADOL 50 MG TAB
50 mg | ORAL_TABLET | Freq: Four times a day (QID) | ORAL | 0 refills | Status: AC | PRN
Start: 2019-11-24 — End: 2019-11-26

## 2019-11-24 MED ORDER — DICLOFENAC 50 MG TAB, DELAYED RELEASE
50 mg | ORAL_TABLET | Freq: Two times a day (BID) | ORAL | 0 refills | Status: AC
Start: 2019-11-24 — End: ?

## 2019-11-24 MED FILL — KETOROLAC TROMETHAMINE 60 MG/2 ML IM: 60 mg/2 mL | INTRAMUSCULAR | Qty: 2

## 2019-11-24 MED FILL — TRAMADOL 50 MG TAB: 50 mg | ORAL | Qty: 1
